# Patient Record
Sex: Male | Born: 1995 | Race: White | Hispanic: No | Marital: Single | Smoking: Former smoker
Health system: Southern US, Community
[De-identification: ages and names within clinical notes are randomized; demographics above are authoritative.]

## PROBLEM LIST (undated history)

## (undated) DIAGNOSIS — D649 Anemia, unspecified: Secondary | ICD-10-CM

## (undated) HISTORY — PX: APPENDECTOMY: SHX54

## (undated) HISTORY — PX: NO PAST SURGERIES: SHX2092

---
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
## 2014-05-27 NOTE — Op Note (Signed)
PATIENT NAME:  Roy BabinskiHUNT, [name] MR#:  [id number] DATE OF BIRTH:  1995-03-15  DATE OF PROCEDURE:  05/18/2011  OPERATION PERFORMED: Laparoscopic appendectomy.   PREOPERATIVE DIAGNOSIS: Acute appendicitis.   POSTOPERATIVE DIAGNOSIS: Acute appendicitis (nonsuppurative).   SURGEON: Claude MangesWilliam F. Myanna Ziesmer, MD  ANESTHESIA: General.   PROCEDURE IN DETAIL: The patient was placed supine on the Operating Room table and prepped and draped in the usual sterile fashion. A 15 mmHg CO2 pneumoperitoneum was created via a Hassan cannula that was introduced in the supraumbilical midline amidst horizontal mattress sutures of 0 Vicryl. Two additional 5 mm trocars were placed under direct visualization. The appendix was seen to be short but certainly edematous (particularly near the end) with a very, very small amount of exudate. There was no pus and the fluid within the pelvis appeared only slightly cloudy but was essentially clear. The mesoappendix was taken down with the Harmonic scalpel and the appendectomy was performed with an Endo GIA stapling device at the base of the appendix where it joined the cecum. It was placed in an Endo Catch bag and [facility] from the abdomen [facility]. The right lower quadrant was irrigated with warm normal saline. This was suctioned clear from the right lower quadrant and the pelvis and the peritoneum was desufflated and decannulated and the linea alba was closed with a running 0 PDS suture and the previously placed Vicryls were tied one to another. All three skin sites were closed with subcuticular 5-0 Monocryl and suture strips. The patient tolerated the procedure well. There were no complications.   ____________________________ Claude MangesWilliam F. Vayda Dungee, MD wfm:cms D: 05/18/2011 05:33:25 ET T: 05/18/2011 08:23:18 ET JOB#: [id number]  cc: Claude MangesWilliam F. Lenzi Marmo, MD, <Dictator> Claude MangesWILLIAM F Cleveland Paiz MD ELECTRONICALLY SIGNED 05/22/2011 9:04

## 2014-05-27 NOTE — H&P (Signed)
    History of Present Illness 15 yowm who had ~ 1 week of central abdominal pain from end of March to early April associated with nausea and vomiting. Pain resolved for ~ 1 week. Pain, nausea, vomiting and anorexia returned 5 days ago. No fever. Last meal 24 hours ago. Pt has lost weight.   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    Place of Living Home   Review of Systems:   Fever/Chills No    Cough No    Sputum No    Abdominal Pain Yes    Diarrhea No    Constipation No    Nausea/Vomiting Yes    SOB/DOE No    Chest Pain No    Dysuria No    Tolerating PT Yes    Tolerating Diet No  Nauseated  Vomiting    Medications/Allergies Reviewed Medications/Allergies reviewed   Physical Exam:   GEN well developed, well nourished, thin, quiet    HEENT pink conjunctivae, PERRL, hearing intact to voice, moist oral mucosa, Oropharynx clear, good dentition    NECK supple    RESP normal resp effort  clear BS  no use of accessory muscles    CARD regular rate  no murmur  no Rub    ABD positive tenderness  normal BS  RLQ > RUQ > R suprapubic >> L side    EXTR negative cyanosis/clubbing, negative edema    SKIN normal to palpation, No rashes, skin turgor good    NEURO cranial nerves intact, motor/sensory function intact    PSYCH alert, A+O to time, place, person, stoic   Routine UA:  14-Apr-13 19:54    Color (UA) Yellow   Clarity (UA) Hazy   Glucose (UA) Negative   Bilirubin (UA) Negative   Ketones (UA) Negative   Specific Gravity (UA) 1.015   Blood (UA) Negative   pH (UA)  7.0   Protein (UA) Negative   Nitrite (UA) Negative   Leukocyte Esterase (UA) Negative   RBC (UA) <1 /HPF   WBC (UA) 2 /HPF   Bacteria (UA) TRACE   Mucous (UA) PRESENT  Routine Hem:  14-Apr-13 19:54    WBC (CBC) 17.0   RBC (CBC) 5.07   Hemoglobin (CBC) 15.6   Hematocrit (CBC) 45.5   Platelet Count (CBC) 227   MCV 90   MCH 30.7   MCHC 34.2   RDW 12.6  Routine Chem:  14-Apr-13 19:54    Lipase 99     Assessment/Admission Diagnosis CT - mildly dilated and thick-walled appendix  Acute (relapsing) Appendicitis    Plan Laparoscopic Appendectomy   Electronic Signatures: Claude MangesMarterre, Jayleon Mcfarlane F (MD)  (Signed 15-Apr-13 03:40)  Authored: CHIEF COMPLAINT and HISTORY, PAST MEDICAL/SURGIAL HISTORY, ALLERGIES, HOME MEDICATIONS, FAMILY AND SOCIAL HISTORY, REVIEW OF SYSTEMS, PHYSICAL EXAM, LABS, ASSESSMENT AND PLAN   Last Updated: 15-Apr-13 03:40 by Claude MangesMarterre, Ileane Sando F (MD)

## 2018-05-20 ENCOUNTER — Emergency Department: Payer: PRIVATE HEALTH INSURANCE

## 2018-05-20 ENCOUNTER — Encounter: Payer: Self-pay | Admitting: Intensive Care

## 2018-05-20 ENCOUNTER — Inpatient Hospital Stay
Admission: EM | Admit: 2018-05-20 | Discharge: 2018-05-23 | DRG: 871 | Disposition: A | Discharge status: Home / Self Care | Payer: PRIVATE HEALTH INSURANCE | Attending: Internal Medicine | Admitting: Internal Medicine

## 2018-05-20 ENCOUNTER — Other Ambulatory Visit: Payer: Self-pay

## 2018-05-20 DIAGNOSIS — J189 Pneumonia, unspecified organism: Secondary | ICD-10-CM | POA: Diagnosis not present

## 2018-05-20 DIAGNOSIS — R55 Syncope and collapse: Secondary | ICD-10-CM

## 2018-05-20 DIAGNOSIS — E876 Hypokalemia: Secondary | ICD-10-CM | POA: Diagnosis not present

## 2018-05-20 DIAGNOSIS — Z20828 Contact with and (suspected) exposure to other viral communicable diseases: Secondary | ICD-10-CM | POA: Diagnosis present

## 2018-05-20 DIAGNOSIS — A419 Sepsis, unspecified organism: Principal | ICD-10-CM | POA: Diagnosis present

## 2018-05-20 DIAGNOSIS — D649 Anemia, unspecified: Secondary | ICD-10-CM | POA: Diagnosis not present

## 2018-05-20 HISTORY — DX: Anemia, unspecified: D64.9

## 2018-05-20 HISTORY — DX: Sepsis, unspecified organism: A41.9

## 2018-05-20 HISTORY — DX: Sepsis, unspecified organism: J18.9

## 2018-05-20 LAB — INFLUENZA PANEL BY PCR (TYPE A & B)
Influenza A By PCR: NEGATIVE
Influenza B By PCR: NEGATIVE

## 2018-05-20 LAB — COMPREHENSIVE METABOLIC PANEL
ALT: 40 U/L (ref 0–44)
AST: 52 U/L — ABNORMAL HIGH (ref 15–41)
Albumin: 3.4 g/dL — ABNORMAL LOW (ref 3.5–5.0)
Alkaline Phosphatase: 73 U/L (ref 38–126)
Anion gap: 15 (ref 5–15)
BUN: 10 mg/dL (ref 6–20)
CO2: 26 mmol/L (ref 22–32)
Calcium: 8.6 mg/dL — ABNORMAL LOW (ref 8.9–10.3)
Chloride: 92 mmol/L — ABNORMAL LOW (ref 98–111)
Creatinine, Ser: 0.71 mg/dL (ref 0.61–1.24)
GFR calc Af Amer: >60 mL/min (ref >60)
GFR calc non Af Amer: >60 mL/min (ref >60)
Glucose, Bld: 112 mg/dL — ABNORMAL HIGH (ref 70–99)
Potassium: 3.4 mmol/L — ABNORMAL LOW (ref 3.5–5.1)
Sodium: 133 mmol/L — ABNORMAL LOW (ref 135–145)
Total Bilirubin: 1.5 mg/dL — ABNORMAL HIGH (ref 0.3–1.2)
Total Protein: 8.4 g/dL — ABNORMAL HIGH (ref 6.5–8.1)

## 2018-05-20 LAB — URINALYSIS, COMPLETE (UACMP) WITH MICROSCOPIC
Bacteria, UA: NONE SEEN
Bilirubin Urine: NEGATIVE
Glucose, UA: NEGATIVE mg/dL
Hgb urine dipstick: NEGATIVE
Ketones, ur: 20 mg/dL — AB
Leukocytes,Ua: NEGATIVE
Nitrite: NEGATIVE
Protein, ur: NEGATIVE mg/dL
Specific Gravity, Urine: 1.008 (ref 1.005–1.030)
pH: 7 (ref 5.0–8.0)

## 2018-05-20 LAB — CBC WITH DIFFERENTIAL/PLATELET
Abs Immature Granulocytes: 0.13 10*3/uL — ABNORMAL HIGH (ref 0.00–0.07)
Basophils Absolute: 0 10*3/uL (ref 0.0–0.1)
Basophils Relative: 0 %
Eosinophils Absolute: 0 10*3/uL (ref 0.0–0.5)
Eosinophils Relative: 0 %
HCT: 35.4 % — ABNORMAL LOW (ref 39.0–52.0)
Hemoglobin: 12 g/dL — ABNORMAL LOW (ref 13.0–17.0)
Immature Granulocytes: 1 %
Lymphocytes Relative: 6 %
Lymphs Abs: 1 10*3/uL (ref 0.7–4.0)
MCH: 30.2 pg (ref 26.0–34.0)
MCHC: 33.9 g/dL (ref 30.0–36.0)
MCV: 89.2 fL (ref 80.0–100.0)
Monocytes Absolute: 0.6 10*3/uL (ref 0.1–1.0)
Monocytes Relative: 3 %
Neutro Abs: 16.4 10*3/uL — ABNORMAL HIGH (ref 1.7–7.7)
Neutrophils Relative %: 90 %
Platelets: 622 10*3/uL — ABNORMAL HIGH (ref 150–400)
RBC: 3.97 MIL/uL — ABNORMAL LOW (ref 4.22–5.81)
RDW: 11.9 % (ref 11.5–15.5)
WBC: 18.2 10*3/uL — ABNORMAL HIGH (ref 4.0–10.5)
nRBC: 0 % (ref 0.0–0.2)

## 2018-05-20 LAB — PROCALCITONIN: Procalcitonin: <0.1 ng/mL

## 2018-05-20 LAB — LACTIC ACID, PLASMA: Lactic Acid, Venous: 1.5 mmol/L (ref 0.5–1.9)

## 2018-05-20 LAB — SARS CORONAVIRUS 2 BY RT PCR (HOSPITAL ORDER, PERFORMED IN ~~LOC~~ HOSPITAL LAB): SARS Coronavirus 2: NEGATIVE

## 2018-05-20 MED ORDER — SODIUM CHLORIDE 0.9 % IV BOLUS
1000.0000 mL | Freq: Once | INTRAVENOUS | Status: AC
  Administered 2018-05-20: 1000 mL via INTRAVENOUS

## 2018-05-20 MED ORDER — VANCOMYCIN HCL 10 G IV SOLR
1500.0000 mg | Freq: Once | INTRAVENOUS | Status: AC
  Administered 2018-05-21: 01:00:00 1500 mg via INTRAVENOUS
  Filled 2018-05-20: qty 1500

## 2018-05-20 MED ORDER — GUAIFENESIN-DM 100-10 MG/5ML PO SYRP: 10.0000 mL | ORAL_SOLUTION | ORAL | Status: DC | PRN

## 2018-05-20 MED ORDER — ACETAMINOPHEN 325 MG PO TABS
650.0000 mg | ORAL_TABLET | Freq: Four times a day (QID) | ORAL | Status: DC | PRN
  Administered 2018-05-21 – 2018-05-23 (×3): 650 mg via ORAL
  Filled 2018-05-20 (×4): qty 2

## 2018-05-20 MED ORDER — ONDANSETRON HCL 4 MG/2ML IJ SOLN
4.0000 mg | Freq: Once | INTRAMUSCULAR | Status: AC
  Administered 2018-05-20: 17:00:00 4 mg via INTRAVENOUS
  Filled 2018-05-20: qty 2

## 2018-05-20 MED ORDER — ENOXAPARIN SODIUM 40 MG/0.4ML ~~LOC~~ SOLN
40.0000 mg | SUBCUTANEOUS | Status: DC
  Administered 2018-05-21 – 2018-05-22 (×3): 40 mg via SUBCUTANEOUS
  Filled 2018-05-20 (×3): qty 0.4

## 2018-05-20 MED ORDER — ONDANSETRON HCL 4 MG PO TABS: 4.0000 mg | ORAL_TABLET | Freq: Four times a day (QID) | ORAL | Status: DC | PRN

## 2018-05-20 MED ORDER — ACETAMINOPHEN 325 MG PO TABS
650.0000 mg | ORAL_TABLET | Freq: Once | ORAL | Status: AC
  Administered 2018-05-20: 17:00:00 650 mg via ORAL
  Filled 2018-05-20: qty 2

## 2018-05-20 MED ORDER — ONDANSETRON HCL 4 MG/2ML IJ SOLN: 4.0000 mg | Freq: Four times a day (QID) | INTRAMUSCULAR | Status: DC | PRN

## 2018-05-20 MED ORDER — BENZONATATE 100 MG PO CAPS: 200.0000 mg | ORAL_CAPSULE | Freq: Three times a day (TID) | ORAL | Status: DC | PRN

## 2018-05-20 MED ORDER — SODIUM CHLORIDE 0.9 % IV SOLN
1.0000 g | Freq: Once | INTRAVENOUS | Status: AC
  Administered 2018-05-20: 1 g via INTRAVENOUS
  Filled 2018-05-20: qty 10

## 2018-05-20 MED ORDER — SODIUM CHLORIDE 0.9 % IV SOLN
2.0000 g | Freq: Three times a day (TID) | INTRAVENOUS | Status: DC
  Administered 2018-05-20 – 2018-05-21 (×3): 2 g via INTRAVENOUS
  Filled 2018-05-20 (×4): qty 2

## 2018-05-20 MED ORDER — SODIUM CHLORIDE 0.9 % IV SOLN
500.0000 mg | Freq: Once | INTRAVENOUS | Status: AC
  Administered 2018-05-20: 21:00:00 500 mg via INTRAVENOUS
  Filled 2018-05-20: qty 500

## 2018-05-20 NOTE — ED Notes (Signed)
Informed by 2A bed assignment may change; will give report once RN available.

## 2018-05-20 NOTE — Consult Note (Signed)
Pharmacy Antibiotic Note  Roy Torres is a 23 y.o. male admitted on 05/20/2018 with sepsis.  Pharmacy has been consulted for Cefepime and Vancomycin dosing.  Plan: Vancomycin 1500mg  IV loading dose x 1. Followed by Vancomycin 1250 mg IV Q 12 hrs. Goal AUC 400-550. Expected AUC: 461.3 SCr used: 0.8 Css Min: 10.6  Start Cefepime 2g IV every 8 hours    Height: 5\' 11"  (180.3 cm) Weight: 145 lb (65.8 kg) IBW/kg (Calculated) : 75.3  Temp (24hrs), Avg:100.4 F (38 C), Min:100.4 F (38 C), Max:100.4 F (38 C)  Recent Labs  Lab 05/20/18 1558 05/20/18 1605  WBC  --  18.2*  CREATININE  --  0.71  LATICACIDVEN 1.5  --     Estimated Creatinine Clearance: 132.5 mL/min (by C-G formula based on SCr of 0.71 mg/dL).    No Known Allergies  Antimicrobials this admission: 4/17 Ceftriaxone/Azithro >> x 1   4/17 Cefepime >>  4/17 Vancomycin>>  Microbiology results: 4/17 BCx: pending 4/17 UCx: pending 4/17 Resp PCR: pending  4/17 SARS Coronavirus 2: negative   Thank you for allowing pharmacy to be a part of this patient's care.  Gardner Candle, PharmD, BCPS Clinical Pharmacist 05/20/2018 10:00 PM

## 2018-05-20 NOTE — ED Notes (Signed)
Pt leaving for imaging.

## 2018-05-20 NOTE — ED Notes (Signed)
Precautions initiated/continued. Visible/hung on door.

## 2018-05-20 NOTE — ED Notes (Addendum)
Pt denies hitting head. Syncopal episode witnessed. States sat on buttocks when fell. Denies complete LOC; states couldn't see but could still hear. Pt denies any needs. Offered blanket; denied. States has not taken any tylenol or ibuprofen today.

## 2018-05-20 NOTE — ED Notes (Signed)
Only 50mL + able to fill on 2nd blue top of cultures. Unable to pull red with butterfly. Sent to lab.

## 2018-05-20 NOTE — ED Notes (Signed)
ED TO INPATIENT HANDOFF REPORT  ED Nurse Name and Phone #: Greta Doom [phone]  S Name/Age/Gender Roy Torres 23 y.o. male Room/Bed: ED25A/ED25A  Code Status   Code Status: Not on file  Home/SNF/Other Home Patient oriented to: self, place, time and situation Is this baseline? Yes   Triage Complete: Triage complete  Chief Complaint Passed out  Triage Note Pt presents after syncopal episode x 1 hr ago. Pt states he was called by his PCP regarding anemia. Pt states while he was getting ready to go get rx, he passed out. Pt denies injury.   Allergies No Known Allergies  Level of Care/Admitting Diagnosis ED Disposition    ED Disposition Condition Comment   Admit  Hospital Area: [facility] [[id number]]  Level of Care: Med-Surg [16]  Covid Evaluation: Person Under Investigation (PUI)  Isolation Risk Level: Low Risk  (Less than 4L [place] supplementation)  Diagnosis: Sepsis [facility]) [[id number]]  Admitting Physician: Oralia Manis [[id number]]  Attending Physician: Oralia Manis [phone]  Estimated length of stay: past midnight tomorrow  Certification:: I certify this patient will need inpatient services for at least 2 midnights  PT Class (Do Not Modify): Inpatient [101]  PT Acc Code (Do Not Modify): Private [1]       B Medical/Surgery History Past Medical History:  Diagnosis Date  . Anemia    Past Surgical History:  Procedure Laterality Date  . NO PAST SURGERIES       A IV Location/Drains/Wounds Patient Lines/Drains/Airways Status   Active Line/Drains/Airways    Name:   Placement date:   Placement time:   Site:   Days:   Peripheral IV 05/20/18 Left Arm   05/20/18    1603    Arm   less than 1   Peripheral IV 05/20/18 Left Forearm   05/20/18    1759    Forearm   less than 1          Intake/Output Last 24 hours No intake or output data in the 24 hours ending 05/20/18 2122  Labs/Imaging Results for orders placed or performed during the hospital  encounter of 05/20/18 (from the past 48 hour(s))  Lactic acid, plasma     Status: None   Collection Time: 05/20/18  3:58 PM  Result Value Ref Range   Lactic Acid, Venous 1.5 0.5 - 1.9 mmol/L    Comment: Performed at [facility], [address] [place], [place] [address]  Procalcitonin     Status: None   Collection Time: 05/20/18  4:05 PM  Result Value Ref Range   Procalcitonin <0.10 ng/mL    Comment:        Interpretation: PCT (Procalcitonin) <= 0.5 ng/mL: Systemic infection (sepsis) is not likely. Local bacterial infection is possible. (NOTE)       Sepsis PCT Algorithm           Lower Respiratory Tract                                      Infection PCT Algorithm    ----------------------------     ----------------------------         PCT < 0.25 ng/mL                PCT < 0.10 ng/mL         Strongly encourage             Strongly  discourage   discontinuation of antibiotics    initiation of antibiotics    ----------------------------     -----------------------------       PCT 0.25 - 0.50 ng/mL            PCT 0.10 - 0.25 ng/mL               OR       >80% decrease in PCT            Discourage initiation of                                            antibiotics      Encourage discontinuation           of antibiotics    ----------------------------     -----------------------------         PCT >= 0.50 ng/mL              PCT 0.26 - 0.50 ng/mL               AND        <80% decrease in PCT             Encourage initiation of                                             antibiotics       Encourage continuation           of antibiotics    ----------------------------     -----------------------------        PCT >= 0.50 ng/mL                  PCT > 0.50 ng/mL               AND         increase in PCT                  Strongly encourage                                      initiation of antibiotics    Strongly encourage escalation           of antibiotics                                      -----------------------------                                           PCT <= 0.25 ng/mL                                                 OR                                        >  80% decrease in PCT                                     Discontinue / Do not initiate                                             antibiotics Performed at [facility], [address] [place], [place] [address]   CBC WITH DIFFERENTIAL     Status: Abnormal   Collection Time: 05/20/18  4:05 PM  Result Value Ref Range   WBC 18.2 (H) 4.0 - 10.5 K/uL   RBC 3.97 (L) 4.22 - 5.81 MIL/uL   Hemoglobin 12.0 (L) 13.0 - 17.0 g/dL   HCT 60.4 (L) 54.0 - 98.1 %   MCV 89.2 80.0 - 100.0 fL   MCH 30.2 26.0 - 34.0 pg   MCHC 33.9 30.0 - 36.0 g/dL   RDW 19.1 47.8 - 29.5 %   Platelets 622 (H) 150 - 400 K/uL   nRBC 0.0 0.0 - 0.2 %   Neutrophils Relative % 90 %   Neutro Abs 16.4 (H) 1.7 - 7.7 K/uL   Lymphocytes Relative 6 %   Lymphs Abs 1.0 0.7 - 4.0 K/uL   Monocytes Relative 3 %   Monocytes Absolute 0.6 0.1 - 1.0 K/uL   Eosinophils Relative 0 %   Eosinophils Absolute 0.0 0.0 - 0.5 K/uL   Basophils Relative 0 %   Basophils Absolute 0.0 0.0 - 0.1 K/uL   Immature Granulocytes 1 %   Abs Immature Granulocytes 0.13 (H) 0.00 - 0.07 K/uL    Comment: Performed at [facility], [address] [place], [place] [address]  Comprehensive metabolic panel     Status: Abnormal   Collection Time: 05/20/18  4:05 PM  Result Value Ref Range   Sodium 133 (L) 135 - 145 mmol/L   Potassium 3.4 (L) 3.5 - 5.1 mmol/L   Chloride 92 (L) 98 - 111 mmol/L   CO2 26 22 - 32 mmol/L   Glucose, Bld 112 (H) 70 - 99 mg/dL   BUN 10 6 - 20 mg/dL   Creatinine, Ser 8.65 0.61 - 1.24 mg/dL   Calcium 8.6 (L) 8.9 - 10.3 mg/dL   Total Protein 8.4 (H) 6.5 - 8.1 g/dL   Albumin 3.4 (L) 3.5 - 5.0 g/dL   AST 52 (H) 15 - 41 U/L   ALT 40 0 - 44 U/L   Alkaline Phosphatase 73 38 - 126 U/L   Total Bilirubin 1.5 (H) 0.3 - 1.2  mg/dL   GFR calc non Af Amer >60 >60 mL/min   GFR calc Af Amer >60 >60 mL/min   Anion gap 15 5 - 15    Comment: Performed at [facility], [address] [place], [place] [address]  Urinalysis, Complete w Microscopic     Status: Abnormal   Collection Time: 05/20/18  4:05 PM  Result Value Ref Range   Color, Urine YELLOW (A) YELLOW   APPearance CLEAR (A) CLEAR   Specific Gravity, Urine 1.008 1.005 - 1.030   pH 7.0 5.0 - 8.0   Glucose, UA NEGATIVE NEGATIVE mg/dL   Hgb urine dipstick NEGATIVE NEGATIVE   Bilirubin Urine NEGATIVE NEGATIVE   Ketones, ur 20 (A) NEGATIVE mg/dL   Protein, ur NEGATIVE  NEGATIVE mg/dL   Nitrite NEGATIVE NEGATIVE   Leukocytes,Ua NEGATIVE NEGATIVE   RBC / HPF 0-5 0 - 5 RBC/hpf   WBC, UA 0-5 0 - 5 WBC/hpf   Bacteria, UA NONE SEEN NONE SEEN   Squamous Epithelial / LPF 0-5 0 - 5    Comment: Performed at [facility], [address], [place], [place] [address]  SARS Coronavirus 2 [facility] order, Performed in [facility] hospital lab)     Status: None   Collection Time: 05/20/18  6:09 PM  Result Value Ref Range   SARS Coronavirus 2 NEGATIVE NEGATIVE    Comment: (NOTE) If result is NEGATIVE SARS-CoV-2 target nucleic acids are NOT DETECTED. The SARS-CoV-2 RNA is generally detectable in upper and lower  respiratory specimens during the acute phase of infection. The lowest  concentration of SARS-CoV-2 viral copies this assay can detect is 250  copies / mL. A negative result does not preclude SARS-CoV-2 infection  and should not be used as the sole basis for treatment or other  patient management decisions.  A negative result may occur with  improper specimen collection / handling, submission of specimen other  than nasopharyngeal swab, presence of viral mutation(s) within the  areas targeted by this assay, and inadequate number of viral copies  (<250 copies / mL). A negative result must be combined with clinical  observations, patient history,  and epidemiological information. If result is POSITIVE SARS-CoV-2 target nucleic acids are DETECTED. The SARS-CoV-2 RNA is generally detectable in upper and lower  respiratory specimens dur ing the acute phase of infection.  Positive  results are indicative of active infection with SARS-CoV-2.  Clinical  correlation with patient history and other diagnostic information is  necessary to determine patient infection status.  Positive results do  not rule out bacterial infection or co-infection with other viruses. If result is PRESUMPTIVE POSTIVE SARS-CoV-2 nucleic acids MAY BE PRESENT.   A presumptive positive result was obtained on the submitted specimen  and confirmed on repeat testing.  While 2019 novel coronavirus  (SARS-CoV-2) nucleic acids may be present in the submitted sample  additional confirmatory testing may be necessary for epidemiological  and / or clinical management purposes  to differentiate between  SARS-CoV-2 and other Sarbecovirus currently known to infect humans.  If clinically indicated additional testing with an alternate test  methodology [phone]) is advised. The SARS-CoV-2 RNA is generally  detectable in upper and lower respiratory sp ecimens during the acute  phase of infection. The expected result is Negative. Fact Sheet for Patients:  BoilerBrush.com.cy Fact Sheet for Healthcare Providers: https://pope.com/ This test is not yet approved or cleared by the [place] FDA and has been authorized for detection and/or diagnosis of SARS-CoV-2 by FDA under an Emergency Use Authorization (EUA).  This EUA will remain in effect (meaning this test can be used) for the duration of the COVID-19 declaration under Section 564(b)(1) of the Act, 21 U.S.C. section 360bbb-3(b)(1), unless the authorization is terminated or revoked sooner. Performed at [facility], [address], [place], [place] [address]    Dg  Chest Port 1 View  Result Date: 05/20/2018 CLINICAL DATA:  Syncope EXAM: PORTABLE CHEST 1 VIEW COMPARISON:  None. FINDINGS: There is diffuse reticulonodular interstitial disease throughout the lungs bilaterally. There is no appreciable frank airspace consolidation. Heart size and pulmonary vascularity are normal. No adenopathy. No bone lesions. 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 Question atypical infection as a potential etiology for this widespread finding.  There is no airspace consolidation. Heart size and pulmonary vascularity are normal. No adenopathy. Electronically Signed   By: Bretta BangWilliam  Woodruff III M.D.   On: 05/20/2018 16:34   Ct Renal Stone Study  Result Date: 05/20/2018 CLINICAL DATA:  Bilateral flank pain and low-grade fever. EXAM: CT ABDOMEN AND PELVIS WITHOUT CONTRAST TECHNIQUE: Multidetector CT imaging of the abdomen and pelvis was performed following the standard protocol without IV contrast. COMPARISON:  CT 06/03/2011 FINDINGS: Lower chest: Widespread pulmonary density which is interstitial and miliary consistent with acute widespread pneumonia. This could be viral or due to other atypical organisms. No pleural effusion. Hepatobiliary: Normal Pancreas: Normal Spleen: Normal Adrenals/Urinary Tract: Adrenal glands are normal. Kidneys are normal. No cyst, mass, stone or hydronephrosis. Bladder is normal. Stomach/Bowel: No abnormal bowel finding.  Previous appendectomy. Vascular/Lymphatic: Normal Reproductive: Normal Other: No free fluid or air. Musculoskeletal: Normal IMPRESSION: Widespread pulmonary abnormality with interstitial prominence and miliary type airspace filling. This pattern is consistent with acute widespread pneumonia and could be due to atypical organisms such as viral pneumonia or tuberculosis. No pleural effusions. No evidence of urinary tract stone disease. No abdominal or pelvic pathology visible by CT. Previous appendectomy.  Electronically Signed   By: Paulina FusiMark  Shogry M.D.   On: 05/20/2018 16:37    Pending Labs Unresulted Labs (From admission, onward)    Start     Ordered   05/20/18 2053  Respiratory Panel by PCR  Add-on,   AD    Question:  Patient immune status  Answer:  Normal   05/20/18 2052   05/20/18 2053  Influenza panel by PCR (type A & B)  Add-on,   AD     05/20/18 2052   05/20/18 1649  Culture, blood (routine x 2)  BLOOD CULTURE X 2,   STAT     05/20/18 1648   05/20/18 1647  HIV antibody (Routine Testing)  Once,   STAT     05/20/18 1647   05/20/18 1605  Urine culture  ONCE - STAT,   STAT     05/20/18 1605   Signed and Held  SARS Coronavirus 2 [facility](Hospital order, Performed [facility])  (Novel Coronavirus, [facility](Hospital Order))  Tomorrow morning,   R    Question:  Patient immune status  Answer:  Normal   Signed and Held   Signed and Held  CBC  (enoxaparin (LOVENOX)    CrCl >/= 30 ml/min)  Once,   R    Comments:  Baseline for enoxaparin therapy IF NOT ALREADY DRAWN.  Notify MD if PLT < 100 K.    Signed and Held   Signed and Held  Creatinine, serum  (enoxaparin (LOVENOX)    CrCl >/= 30 ml/min)  Once,   R    Comments:  Baseline for enoxaparin therapy IF NOT ALREADY DRAWN.    Signed and Held   Signed and Held  Creatinine, serum  (enoxaparin (LOVENOX)    CrCl >/= 30 ml/min)  Weekly,   R    Comments:  while on enoxaparin therapy    Signed and Held   Signed and Held  ABO/Rh  Once,   R     Signed and Held   Signed and Held  CBC with Differential/Platelet  Daily,   R     Signed and Held   Signed and Held  Comprehensive metabolic panel  Daily,   R     Signed and Held   Signed and Held  MRSA PCR Screening  Once,   R    Question:  Patient immune status  Answer:  Normal   Signed and Held          Vitals/Pain Today's Vitals   05/20/18 1901 05/20/18 1902 05/20/18 1953 05/20/18 2115  BP:  (!) 132/92    Pulse: 88 85 84 87  Resp: 19 20 13  (!) 23  Temp:      TempSrc:      SpO2: 98%  97% 100% 99%  Weight:      Height:      PainSc:        Isolation Precautions Airborne and Contact precautions  Medications Medications  azithromycin (ZITHROMAX) 500 mg in sodium chloride 0.9 % 250 mL IVPB (500 mg Intravenous Transfusing/Transfer 05/20/18 2118)  sodium chloride 0.9 % bolus 1,000 mL (1,000 mLs Intravenous New Bag/Given 05/20/18 1631)  acetaminophen (TYLENOL) tablet 650 mg (650 mg Oral Given 05/20/18 1723)  ondansetron (ZOFRAN) injection 4 mg (4 mg Intravenous Given 05/20/18 1720)  cefTRIAXone (ROCEPHIN) 1 g in sodium chloride 0.9 % 100 mL IVPB (0 g Intravenous Stopped 05/20/18 2114)    Mobility walks Low fall risk   Focused Assessments N/V; controlled with zofran; Cardiac/Resp WDL   R Recommendations: See Admitting Provider Note  Report given to:   Additional Notes:  Septic workup completed; Covid-19 neg; NS bolus/rocephin/azithromycin given; L arm/L fa 20g IV

## 2018-05-20 NOTE — ED Triage Notes (Signed)
Pt presents after syncopal episode x 1 hr ago. Pt states he was called by his PCP regarding anemia. Pt states while he was getting ready to go get rx, he passed out. Pt denies injury.

## 2018-05-20 NOTE — ED Notes (Signed)
2A RN remains unavailable. Will give report once available. Will continue to monitor pt.

## 2018-05-20 NOTE — H&P (Addendum)
[facility] - [place] at [facility]   PATIENT NAME: Roy Torres    MR#:  [id number]  DATE OF BIRTH:  [birth year]  DATE OF ADMISSION:  05/20/2018  PRIMARY CARE PHYSICIAN: Patient, No Pcp Per   REQUESTING/REFERRING PHYSICIAN: Alphonzo Lemmings, MD  CHIEF COMPLAINT:   Chief Complaint  Patient presents with  . Loss of Consciousness    HISTORY OF PRESENT ILLNESS:  Roy Torres  is a 23 y.o. male who presents with chief complaint as above.  Patient presents to the ED with 1 week of intermittent fever, and a syncopal episode today.  Here in the ED he was found to meet sepsis criteria with tachycardia, fever, leukocytosis.  His chest x-ray shows bilateral reticulonodular infiltrate, consistent with atypical infection.  On his differential he is lymphopenic, and his procalcitonin was less than 0.1.  All this raises strong suspicion for possible novel coronavirus infection.  Swab was sent tonight and resulted initially negative.  Hospitalist were called for admission.  PAST MEDICAL HISTORY:   Past Medical History:  Diagnosis Date  . Anemia      PAST SURGICAL HISTORY:   Past Surgical History:  Procedure Laterality Date  . NO PAST SURGERIES       SOCIAL HISTORY:   Social History   Tobacco Use  . Smoking status: Never Smoker  . Smokeless tobacco: Never Used  Substance Use Topics  . Alcohol use: Never    Frequency: Never     FAMILY HISTORY:    Family history reviewed and is non-contributory DRUG ALLERGIES:  No Known Allergies  MEDICATIONS AT HOME:   Prior to Admission medications   Not on File    REVIEW OF SYSTEMS:  Review of Systems  Constitutional: Positive for fever. Negative for chills, malaise/fatigue and weight loss.  HENT: Negative for ear pain, hearing loss and tinnitus.   Eyes: Negative for blurred vision, double vision, pain and redness.  Respiratory: Negative for cough, hemoptysis and shortness of breath.   Cardiovascular: Negative for  chest pain, palpitations, orthopnea and leg swelling.  Gastrointestinal: Positive for nausea. Negative for abdominal pain, constipation, diarrhea and vomiting.  Genitourinary: Negative for dysuria, frequency and hematuria.  Musculoskeletal: Negative for back pain, joint pain and neck pain.  Skin:       No acne, rash, or lesions  Neurological: Positive for loss of consciousness. Negative for dizziness, tremors, focal weakness and weakness.  Endo/Heme/Allergies: Negative for polydipsia. Does not bruise/bleed easily.  Psychiatric/Behavioral: Negative for depression. The patient is not nervous/anxious and does not have insomnia.      VITAL SIGNS:   Vitals:   05/20/18 1800 05/20/18 1823 05/20/18 1901 05/20/18 1902  BP: 124/72   (!) 132/92  Pulse: 97 91 88 85  Resp: Temp:      TempSrc:      SpO2: 98% 97% 98% 97%  Weight:      Height:       Wt Readings from Last 3 Encounters:  05/20/18 65.8 kg    PHYSICAL EXAMINATION:  Physical Exam  Vitals reviewed. Constitutional: He is oriented to person, place, and time. He appears well-developed and well-nourished. No distress.  HENT:  Head: Normocephalic and atraumatic.  Dry mucous membranes  Eyes: Pupils are equal, round, and reactive to light. Conjunctivae and EOM are normal. No scleral icterus.  Neck: Normal range of motion. Neck supple. No JVD present. No thyromegaly present.  Cardiovascular: Regular rhythm and intact distal pulses. Exam reveals no gallop  and no friction rub.  No murmur heard. Tachycardic  Respiratory: Effort normal. No respiratory distress. He has no wheezes. He has no rales.  Fine rhonchi bilaterally  GI: Soft. Bowel sounds are normal. He exhibits no distension. There is no abdominal tenderness.  Musculoskeletal: Normal range of motion.        General: No edema.     Comments: No arthritis, no gout  Lymphadenopathy:    He has no cervical adenopathy.  Neurological: He is alert and oriented to person,  place, and time. No cranial nerve deficit.  No dysarthria, no aphasia  Skin: Skin is warm and dry. No rash noted. No erythema.  Psychiatric: He has a normal mood and affect. His behavior is normal. Judgment and thought content normal.    LABORATORY PANEL:   CBC Recent Labs  Lab 05/20/18 1605  WBC 18.2*  HGB 12.0*  HCT 35.4*  PLT 622*   ------------------------------------------------------------------------------------------------------------------  Chemistries  Recent Labs  Lab 05/20/18 1605  NA 133*  K 3.4*  CL 92*  CO2 26  GLUCOSE 112*  BUN 10  CREATININE 0.71  CALCIUM 8.6*  AST 52*  ALT 40  ALKPHOS 73  BILITOT 1.5*   ------------------------------------------------------------------------------------------------------------------  Cardiac Enzymes No results for input(s): TROPONINI in the last 168 hours. ------------------------------------------------------------------------------------------------------------------  RADIOLOGY:  Dg Chest Port 1 View  Result Date: 05/20/2018 CLINICAL DATA:  Syncope EXAM: PORTABLE CHEST 1 VIEW COMPARISON:  None. FINDINGS: There is diffuse reticulonodular interstitial disease throughout the lungs bilaterally. There is no appreciable frank airspace consolidation. Heart size and pulmonary vascularity are normal. No adenopathy. No bone lesions. 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 Question atypical infection as a potential etiology for this widespread finding. There is no airspace consolidation. Heart size and pulmonary vascularity are normal. No adenopathy. Electronically Signed   By: Bretta Bang III M.D.   On: 05/20/2018 16:34   Ct Renal Stone Study  Result Date: 05/20/2018 CLINICAL DATA:  Bilateral flank pain and low-grade fever. EXAM: CT ABDOMEN AND PELVIS WITHOUT CONTRAST TECHNIQUE: Multidetector CT imaging of the abdomen and pelvis was performed following the standard protocol  without IV contrast. COMPARISON:  CT 06/03/2011 FINDINGS: Lower chest: Widespread pulmonary density which is interstitial and miliary consistent with acute widespread pneumonia. This could be viral or due to other atypical organisms. No pleural effusion. Hepatobiliary: Normal Pancreas: Normal Spleen: Normal Adrenals/Urinary Tract: Adrenal glands are normal. Kidneys are normal. No cyst, mass, stone or hydronephrosis. Bladder is normal. Stomach/Bowel: No abnormal bowel finding.  Previous appendectomy. Vascular/Lymphatic: Normal Reproductive: Normal Other: No free fluid or air. Musculoskeletal: Normal IMPRESSION: Widespread pulmonary abnormality with interstitial prominence and miliary type airspace filling. This pattern is consistent with acute widespread pneumonia and could be due to atypical organisms such as viral pneumonia or tuberculosis. No pleural effusions. No evidence of urinary tract stone disease. No abdominal or pelvic pathology visible by CT. Previous appendectomy. Electronically Signed   By: Paulina Fusi M.D.   On: 05/20/2018 16:37    EKG:   Orders placed or performed during the hospital encounter of 05/20/18  . EKG 12-Lead  . EKG 12-Lead    IMPRESSION AND PLAN:  Principal Problem:   Sepsis (HCC) -IV antibiotics initiated, lactic acid within normal limits.  Blood pressure stable.  Sepsis is due to pneumonia as below.  Cultures sent from the ED. Active Problems:   Atypical pneumonia -patient's clinical and laboratory picture very consistent with normal coronavirus infection.  He was coronavirus PCR negative tonight,  however I am unsure of the sensitivity data on this test.  His clinical picture and laboratory picture is very consistent with coronavirus and lymphopenia, pro calcitonin less than 0.1, and bilateral atypical appearing opacities on imaging.  We have covered him with IV antibiotics for presumed pneumonia at this time, however I will repeat his novel coronavirus test in the morning  just for confirmation.   Suspected COVID-19 infection -given suspicions as above, will retest for novel coronavirus in the morning  Chart review performed and case discussed with ED provider. Labs, imaging and/or ECG reviewed by provider and discussed with patient/family. Management plans discussed with the patient and/or family.  DVT PROPHYLAXIS: SubQ lovenox   GI PROPHYLAXIS:  None  ADMISSION STATUS: Inpatient     CODE STATUS: Full  TOTAL TIME TAKING CARE OF THIS PATIENT: 45 minutes.   Barney DrainDavid F Sou Nohr 05/20/2018, 8:53 PM  [facility] [place] [facility]  Office  [phone]  CC: Primary care physician; Patient, No Pcp Per  Note:  This document was prepared using Dragon voice recognition software and may include unintentional dictation errors.

## 2018-05-20 NOTE — ED Notes (Addendum)
This RN, previously placed on standby secondary to census, now on the floor and looking into patient's ED chart.

## 2018-05-20 NOTE — ED Notes (Signed)
EKG completed for syncopal episode/weakness.

## 2018-05-20 NOTE — ED Notes (Signed)
To consolidate patient care and staff resources, it has been decided that the patient will go into 233 at this time.

## 2018-05-20 NOTE — ED Provider Notes (Addendum)
[facility] Emergency Department Provider Note  ____________________________________________   I have reviewed the triage vital signs and the nursing notes. Where available I have reviewed prior notes and, if possible and indicated, outside hospital notes.    HISTORY  Chief Complaint Loss of Consciousness    HPI Roy Torres is a 23 y.o. 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  Not specifically in any other way unwell.  He states that he has had no dysuria but he has low back pain and he was worried that he might have a urinary tract infection.  He went to his physician and they checked a urine and blood and they called him on the phone today and told him that his blood work showed that he was anemic.  He then stood up rapidly from bed to come in and he felt very lightheaded, he did not completely pass out he did not hit his head but he did fall to the ground.  He did not suffer any injury he did not have a seizure he did not completely lose consciousness he did not bite his tongue he did not lose bowel or bladder he had no focal numbness or weakness.  He states his pain is mostly in the left flank but also some degree in the right flank and has been there for a week.  He has had fevers at home as well as here.  He denies any cough shortness of breath headache stiff neck or abdominal pain.  He has had no melena no bright red blood per rectum no hematemesis.  His fevers at home were low-grade when he checks them.  He has not had any sick contacts that he knows of.  No recent travel.  His only real complaint is that he feels decreased energy and has a low back pain.  Not have a history of IV drug abuse.  The pain is not midline.  It is mostly in the flanks.  He does not have a history of kidney stones although there is a kidney stone history in his family.  He is  not having any chest pain pleuritic or otherwise.  He does not have any cough.  He has not had any stiff neck or headache of any significance.  He denies any rash.  Denies IV drug abuse.     Past Medical History:  Diagnosis Date  . Anemia     There are no active problems to display for this patient.   History reviewed. No pertinent surgical history.  Prior to Admission medications   Not on File    Allergies Patient has no known allergies.  History reviewed. No pertinent family history.  Social History Social History   Tobacco Use  . Smoking status: Never Smoker  . Smokeless tobacco: Never Used  Substance Use Topics  . Alcohol use: Never    Frequency: Never  . Drug use: Never    Review of Systems Constitutional: The HPI Eyes: No visual changes. ENT: No sore throat. No stiff neck no neck pain Cardiovascular: Denies chest pain. Respiratory: Denies shortness of breath. Gastrointestinal:   no vomiting.  No diarrhea.  No constipation. Genitourinary: Negative for dysuria. Musculoskeletal: Negative lower extremity swelling Skin: Negative for rash. Neurological: Negative for severe headaches, focal weakness or numbness.   ____________________________________________   PHYSICAL EXAM:  VITAL SIGNS:  ED Triage Vitals  Enc Vitals Group     BP 05/20/18 1540 (!) 141/80     Pulse Rate 05/20/18 1540 (!) 117     Resp 05/20/18 1556 16     Temp 05/20/18 1540 (!) 100.4 F (38 C)     Temp Source 05/20/18 1540 Oral     SpO2 05/20/18 1540 98 %     Weight 05/20/18 1541 145 lb (65.8 kg)     Height 05/20/18 1541 5\' 11"  (1.803 m)     Head Circumference --      Peak Flow --      Pain Score 05/20/18 1541 0     Pain Loc --      Pain Edu? --      Excl. in GC? --     Constitutional: Alert and oriented. Well appearing and in no acute distress.  Appears as if she does not feel very well but certainly does not appear toxic Eyes: Conjunctivae are normal Head: Atraumatic HEENT:  No congestion/rhinnorhea. Mucous membranes are moist.  Oropharynx non-erythematous Neck:   Nontender with no meningismus, no masses, no stridor Cardiovascular: Normal rate, regular rhythm. Grossly normal heart sounds.  Good peripheral circulation. Respiratory: Normal respiratory effort.  No retractions. Lungs CTAB. Abdominal: Soft and nontender. No distention. No guarding no rebound Back:  There is no focal tenderness or step off.  there is no midline tenderness there are no lesions noted. there is no flank pain left greater than right CVA tenderness Musculoskeletal: No lower extremity tenderness, no upper extremity tenderness. No joint effusions, no DVT signs strong distal pulses no edema Neurologic:  Normal speech and language. No gross focal neurologic deficits are appreciated.  Skin:  Skin is warm, dry and intact. No rash noted. Psychiatric: Mood and affect are normal. Speech and behavior are normal.  ____________________________________________   LABS (all labs ordered are listed, but only abnormal results are displayed)  Labs Reviewed  URINE CULTURE  PROCALCITONIN  CBC WITH DIFFERENTIAL/PLATELET  LACTIC ACID, PLASMA  LACTIC ACID, PLASMA  COMPREHENSIVE METABOLIC PANEL  URINALYSIS, COMPLETE (UACMP) WITH MICROSCOPIC    Pertinent labs  results that were available during my care of the patient were reviewed by me and considered in my medical decision making (see chart for details). ____________________________________________  EKG  I personally interpreted any EKGs ordered by me or triage Sinus mild tachycardia rate 103 no acute ST elevation or depression normal axis unremarkable EKG ____________________________________________  RADIOLOGY  Pertinent labs & imaging results that were available during my care of the patient were reviewed by me and considered in my medical decision making (see chart for details). If possible, patient and/or family made aware of any abnormal  findings.  No results found. ____________________________________________    PROCEDURES  Procedure(s) performed: None  Procedures  Critical Care performed: None  ____________________________________________   INITIAL IMPRESSION / ASSESSMENT AND PLAN / ED COURSE  Pertinent labs & imaging results that were available during my care of the patient were reviewed by me and considered in my medical decision making (see chart for details).  Patient here with a near syncopal event after spending a week in bed and standing up quickly.  He was told he was anemic but does not know the numbers and I cannot gain access to them that I can see in the computer.  He has had at least low-grade temperatures and decreased energy for the last week.  Certainly possible exist this could be coronavirus, however, the focal nature  of his low back pain makes me also concerned about urinary tract infection and/or infected kidney stone.  We will endeavor to work him up for these entities and reassess  ----------------------------------------- 7:28 PM on 05/20/2018 -----------------------------------------  CT scan does not show any evidence of any urinary pathology nor does his urine, his white count somewhat elevated which would be a little bit atypical for coronavirus but his lymphocyte count is now which again is and future catheter level is negative.  Chest x-ray shows a diffuse pathology possibly consistent with coronavirus, we have ordered that test.  We will forego IV antibiotics pending the results of that test.  Given diffused pulmonary involvement, patient is near syncopal event today he likely will need to be admitted to the hospital I would like to have a diagnosis prior to doing so.  Cultures are pending, he is in no acute distress.  He understands what we are doing.  Lactic acid is also negative meaning that his sepsis markers are very much normal.     ____________________________________________   FINAL CLINICAL IMPRESSION(S) / ED DIAGNOSES  Final diagnoses:  None      This chart was dictated using voice recognition software.  Despite best efforts to proofread,  errors can occur which can change meaning.      Jeanmarie Plant, MD 05/20/18 1616    Jeanmarie Plant, MD 05/20/18 1929

## 2018-05-20 NOTE — ED Notes (Signed)
Pt's mother called wanting update on pt. Pt stated he wanted to speak with mother. Given phone; pt updated mother.

## 2018-05-20 NOTE — ED Notes (Signed)
Call bell within reach; pt educated to use it for any needs. Bed in lowest position. Rails up.

## 2018-05-21 LAB — CBC WITH DIFFERENTIAL/PLATELET
Abs Immature Granulocytes: 0.07 10*3/uL (ref 0.00–0.07)
Basophils Absolute: 0 10*3/uL (ref 0.0–0.1)
Basophils Relative: 0 %
Eosinophils Absolute: 0.1 10*3/uL (ref 0.0–0.5)
Eosinophils Relative: 0 %
HCT: 31.8 % — ABNORMAL LOW (ref 39.0–52.0)
Hemoglobin: 10.6 g/dL — ABNORMAL LOW (ref 13.0–17.0)
Immature Granulocytes: 1 %
Lymphocytes Relative: 8 %
Lymphs Abs: 1 10*3/uL (ref 0.7–4.0)
MCH: 30.5 pg (ref 26.0–34.0)
MCHC: 33.3 g/dL (ref 30.0–36.0)
MCV: 91.4 fL (ref 80.0–100.0)
Monocytes Absolute: 0.4 10*3/uL (ref 0.1–1.0)
Monocytes Relative: 4 %
Neutro Abs: 10.6 10*3/uL — ABNORMAL HIGH (ref 1.7–7.7)
Neutrophils Relative %: 87 %
Platelets: 575 10*3/uL — ABNORMAL HIGH (ref 150–400)
RBC: 3.48 MIL/uL — ABNORMAL LOW (ref 4.22–5.81)
RDW: 12.1 % (ref 11.5–15.5)
WBC: 12.2 10*3/uL — ABNORMAL HIGH (ref 4.0–10.5)
nRBC: 0 % (ref 0.0–0.2)

## 2018-05-21 LAB — COMPREHENSIVE METABOLIC PANEL
ALT: 32 U/L (ref 0–44)
AST: 35 U/L (ref 15–41)
Albumin: 2.7 g/dL — ABNORMAL LOW (ref 3.5–5.0)
Alkaline Phosphatase: 54 U/L (ref 38–126)
Anion gap: 12 (ref 5–15)
BUN: 8 mg/dL (ref 6–20)
CO2: 24 mmol/L (ref 22–32)
Calcium: 8.3 mg/dL — ABNORMAL LOW (ref 8.9–10.3)
Chloride: 100 mmol/L (ref 98–111)
Creatinine, Ser: 0.55 mg/dL — ABNORMAL LOW (ref 0.61–1.24)
GFR calc Af Amer: >60 mL/min (ref >60)
GFR calc non Af Amer: >60 mL/min (ref >60)
Glucose, Bld: 92 mg/dL (ref 70–99)
Potassium: 3.4 mmol/L — ABNORMAL LOW (ref 3.5–5.1)
Sodium: 136 mmol/L (ref 135–145)
Total Bilirubin: 1.4 mg/dL — ABNORMAL HIGH (ref 0.3–1.2)
Total Protein: 7.4 g/dL (ref 6.5–8.1)

## 2018-05-21 LAB — RESPIRATORY PANEL BY PCR

## 2018-05-21 LAB — MRSA PCR SCREENING: MRSA by PCR: NEGATIVE

## 2018-05-21 LAB — SARS CORONAVIRUS 2 BY RT PCR (HOSPITAL ORDER, PERFORMED IN ~~LOC~~ HOSPITAL LAB): SARS Coronavirus 2: NEGATIVE

## 2018-05-21 LAB — ABO/RH: ABO/RH(D): A POS

## 2018-05-21 MED ORDER — VANCOMYCIN HCL 10 G IV SOLR
1250.0000 mg | Freq: Two times a day (BID) | INTRAVENOUS | Status: DC
  Administered 2018-05-21: 12:00:00 1250 mg via INTRAVENOUS
  Filled 2018-05-21 (×2): qty 1250

## 2018-05-21 MED ORDER — LEVOFLOXACIN IN D5W 750 MG/150ML IV SOLN
750.0000 mg | INTRAVENOUS | Status: DC
  Administered 2018-05-21 – 2018-05-22 (×2): 750 mg via INTRAVENOUS
  Filled 2018-05-21 (×3): qty 150

## 2018-05-21 MED ORDER — SODIUM CHLORIDE 0.9 % IV SOLN
INTRAVENOUS | Status: DC | PRN
  Administered 2018-05-21 – 2018-05-23 (×4): 250 mL via INTRAVENOUS

## 2018-05-21 NOTE — Plan of Care (Signed)
  Problem: Education: Goal: Knowledge of General Education information will improve Description: Including pain rating scale, medication(s)/side effects and non-pharmacologic comfort measures Outcome: Progressing   Problem: Clinical Measurements: Goal: Diagnostic test results will improve Outcome: Progressing   Problem: Activity: Goal: Risk for activity intolerance will decrease Outcome: Progressing   

## 2018-05-21 NOTE — Progress Notes (Signed)
[facility] - [place] at [facility]   PATIENT NAME: Roy Torres    MR#:  [id number]  DATE OF BIRTH:  1995/05/25  SUBJECTIVE:  CHIEF COMPLAINT:   Chief Complaint  Patient presents with  . Loss of Consciousness   Still has dry cough. Febrile. Myalgias. No diarrhea.  REVIEW OF SYSTEMS:    Review of Systems  Constitutional: Positive for chills, fever and malaise/fatigue.  HENT: Negative for sore throat.   Eyes: Negative for blurred vision, double vision and pain.  Respiratory: Positive for cough and shortness of breath. Negative for hemoptysis and wheezing.   Cardiovascular: Negative for chest pain, palpitations, orthopnea and leg swelling.  Gastrointestinal: Negative for abdominal pain, constipation, diarrhea, heartburn, nausea and vomiting.  Genitourinary: Negative for dysuria and hematuria.  Musculoskeletal: Positive for myalgias. Negative for back pain and joint pain.  Skin: Negative for rash.  Neurological: Negative for sensory change, speech change, focal weakness and headaches.  Endo/Heme/Allergies: Does not bruise/bleed easily.  Psychiatric/Behavioral: Negative for depression. The patient is not nervous/anxious.     DRUG ALLERGIES:  No Known Allergies  VITALS:  Blood pressure (!) 114/59, pulse 95, temperature 99.9 F (37.7 C), temperature source Oral, resp. rate 20, height 5\' 11"  (1.803 m), weight 65.8 kg, SpO2 96 %.  PHYSICAL EXAMINATION:   Physical Exam  GENERAL:  23 y.o.-year-old patient lying in the bed with no acute distress.  EYES: Pupils equal, round, reactive to light and accommodation. No scleral icterus. Extraocular muscles intact.  HEENT: Head atraumatic, normocephalic. Oropharynx and nasopharynx clear.  NECK:  Supple, no jugular venous distention. No thyroid enlargement, no tenderness.  LUNGS: Normal breath sounds bilaterally, no wheezing, rales, rhonchi. No use of accessory muscles of respiration.  CARDIOVASCULAR: S1, S2 normal. No  murmurs, rubs, or gallops.  ABDOMEN: Soft, nontender, nondistended. Bowel sounds present. No organomegaly or mass.  EXTREMITIES: No cyanosis, clubbing or edema b/l.    NEUROLOGIC: Cranial nerves II through XII are intact. No focal Motor or sensory deficits b/l.   PSYCHIATRIC: The patient is alert and oriented x 3.  SKIN: No obvious rash, lesion, or ulcer.   LABORATORY PANEL:   CBC Recent Labs  Lab 05/21/18 0632  WBC 12.2*  HGB 10.6*  HCT 31.8*  PLT 575*   ------------------------------------------------------------------------------------------------------------------ Chemistries  Recent Labs  Lab 05/21/18 0632  NA 136  K 3.4*  CL 100  CO2 24  GLUCOSE 92  BUN 8  CREATININE 0.55*  CALCIUM 8.3*  AST 35  ALT 32  ALKPHOS 54  BILITOT 1.4*   ------------------------------------------------------------------------------------------------------------------  Cardiac Enzymes No results for input(s): TROPONINI in the last 168 hours. ------------------------------------------------------------------------------------------------------------------  RADIOLOGY:  Dg Chest Port 1 View  Result Date: 05/20/2018 CLINICAL DATA:  Syncope EXAM: PORTABLE CHEST 1 VIEW COMPARISON:  None. FINDINGS: There is diffuse reticulonodular interstitial disease throughout the lungs bilaterally. There is no appreciable frank airspace consolidation. Heart size and pulmonary vascularity are normal. No adenopathy. No bone lesions. 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 Question atypical infection as a potential etiology for this widespread finding. There is no airspace consolidation. Heart size and pulmonary vascularity are normal. No adenopathy. Electronically Signed   By: Bretta BangWilliam  Woodruff III M.D.   On: 05/20/2018 16:34   Ct Renal Stone Study  Result Date: 05/20/2018 CLINICAL DATA:  Bilateral flank pain and low-grade fever. EXAM: CT ABDOMEN AND PELVIS WITHOUT  CONTRAST TECHNIQUE: Multidetector CT imaging of the abdomen and pelvis was performed following the standard protocol without IV contrast.  COMPARISON:  CT 06/03/2011 FINDINGS: Lower chest: Widespread pulmonary density which is interstitial and miliary consistent with acute widespread pneumonia. This could be viral or due to other atypical organisms. No pleural effusion. Hepatobiliary: Normal Pancreas: Normal Spleen: Normal Adrenals/Urinary Tract: Adrenal glands are normal. Kidneys are normal. No cyst, mass, stone or hydronephrosis. Bladder is normal. Stomach/Bowel: No abnormal bowel finding.  Previous appendectomy. Vascular/Lymphatic: Normal Reproductive: Normal Other: No free fluid or air. Musculoskeletal: Normal IMPRESSION: Widespread pulmonary abnormality with interstitial prominence and miliary type airspace filling. This pattern is consistent with acute widespread pneumonia and could be due to atypical organisms such as viral pneumonia or tuberculosis. No pleural effusions. No evidence of urinary tract stone disease. No abdominal or pelvic pathology visible by CT. Previous appendectomy. Electronically Signed   By: Paulina Fusi M.D.   On: 05/20/2018 16:37     ASSESSMENT AND PLAN:    * Sepsis secondary to atypical pneumonia On IV abx COVID negative But due to high suspicion I ordered repeat testing Blood cx pending  * Hypokalemia Replace PO  * Normocytic anemia Monitor No bleeding  All the records are reviewed and case discussed with Care Management/Social Worker Management plans discussed with the patient, family and they are in agreement.  CODE STATUS: FULL CODE  DVT Prophylaxis: SCDs  TOTAL TIME TAKING CARE OF THIS PATIENT: 35 minutes.   POSSIBLE D/C IN 1-2 DAYS, DEPENDING ON CLINICAL CONDITION.  Orie Fisherman M.D on 05/21/2018 at 10:58 AM  Between 7am to 6pm - Pager - [phone]  After 6pm go to [email or web address] - password [email or web address]  [facility] [place] [facility]  Office   [phone]  CC: Primary care physician; Patient, No Pcp Per  Note: This dictation was prepared with Dragon dictation along with smaller phrase technology. Any transcriptional errors that result from this process are unintentional.

## 2018-05-22 LAB — CBC WITH DIFFERENTIAL/PLATELET
Abs Immature Granulocytes: 0.05 10*3/uL (ref 0.00–0.07)
Basophils Absolute: 0 10*3/uL (ref 0.0–0.1)
Basophils Relative: 0 %
Eosinophils Absolute: 0 10*3/uL (ref 0.0–0.5)
Eosinophils Relative: 0 %
HCT: 33.3 % — ABNORMAL LOW (ref 39.0–52.0)
Hemoglobin: 11.2 g/dL — ABNORMAL LOW (ref 13.0–17.0)
Immature Granulocytes: 0 %
Lymphocytes Relative: 7 %
Lymphs Abs: 0.8 10*3/uL (ref 0.7–4.0)
MCH: 30.2 pg (ref 26.0–34.0)
MCHC: 33.6 g/dL (ref 30.0–36.0)
MCV: 89.8 fL (ref 80.0–100.0)
Monocytes Absolute: 0.4 10*3/uL (ref 0.1–1.0)
Monocytes Relative: 4 %
Neutro Abs: 10.2 10*3/uL — ABNORMAL HIGH (ref 1.7–7.7)
Neutrophils Relative %: 89 %
Platelets: 616 10*3/uL — ABNORMAL HIGH (ref 150–400)
RBC: 3.71 MIL/uL — ABNORMAL LOW (ref 4.22–5.81)
RDW: 12 % (ref 11.5–15.5)
WBC: 11.5 10*3/uL — ABNORMAL HIGH (ref 4.0–10.5)
nRBC: 0 % (ref 0.0–0.2)

## 2018-05-22 LAB — COMPREHENSIVE METABOLIC PANEL
ALT: 39 U/L (ref 0–44)
AST: 37 U/L (ref 15–41)
Albumin: 2.9 g/dL — ABNORMAL LOW (ref 3.5–5.0)
Alkaline Phosphatase: 67 U/L (ref 38–126)
Anion gap: 12 (ref 5–15)
BUN: 7 mg/dL (ref 6–20)
CO2: 26 mmol/L (ref 22–32)
Calcium: 8.7 mg/dL — ABNORMAL LOW (ref 8.9–10.3)
Chloride: 99 mmol/L (ref 98–111)
Creatinine, Ser: 0.6 mg/dL — ABNORMAL LOW (ref 0.61–1.24)
GFR calc Af Amer: >60 mL/min (ref >60)
GFR calc non Af Amer: >60 mL/min (ref >60)
Glucose, Bld: 100 mg/dL — ABNORMAL HIGH (ref 70–99)
Potassium: 3.6 mmol/L (ref 3.5–5.1)
Sodium: 137 mmol/L (ref 135–145)
Total Bilirubin: 1.5 mg/dL — ABNORMAL HIGH (ref 0.3–1.2)
Total Protein: 8 g/dL (ref 6.5–8.1)

## 2018-05-22 LAB — URINE CULTURE: Culture: NO GROWTH

## 2018-05-22 MED ORDER — ENSURE MAX PROTEIN PO LIQD
11.0000 [oz_av] | Freq: Two times a day (BID) | ORAL | Status: DC
  Administered 2018-05-22 – 2018-05-23 (×2): 11 [oz_av] via ORAL
  Filled 2018-05-22: qty 330

## 2018-05-22 MED ORDER — ADULT MULTIVITAMIN W/MINERALS CH
1.0000 | ORAL_TABLET | Freq: Every day | ORAL | Status: DC
  Administered 2018-05-23: 1 via ORAL
  Filled 2018-05-22: qty 1

## 2018-05-22 MED ORDER — SODIUM CHLORIDE 0.9% FLUSH
10.0000 mL | Freq: Two times a day (BID) | INTRAVENOUS | Status: DC
  Administered 2018-05-22 – 2018-05-23 (×2): 10 mL via INTRAVENOUS

## 2018-05-22 NOTE — Progress Notes (Signed)
Initial Nutrition Assessment  RD working remotely.  DOCUMENTATION CODES:   Not applicable  INTERVENTION:  Provide Ensure Max Protein po BID, each supplement provides 150 kcal and 30 grams of protein.  Provide daily MVI.  Encouraged adequate intake of calories and protein at meals.  NUTRITION DIAGNOSIS:   Inadequate oral intake related to decreased appetite as evidenced by per patient/family report.  GOAL:   Patient will meet greater than or equal to 90% of their needs  MONITOR:   PO intake, Supplement acceptance, Labs, Weight trends, I & O's  REASON FOR ASSESSMENT:   Malnutrition Screening Tool    ASSESSMENT:   23 year old male with PMHx of anemia admitted with sepsis secondary to atypical PNA, found to be negative for COVID-19.   Spoke with patient over the phone to obtain nutrition/weight history. He reports he had a decreased appetite/intake for about one week. He was still attempting to eat 2-3 meals per day but was eating less at meals. Unable to provide exact details. Patient reports his appetite is starting to improve now. He ate 100% of breakfast this morning. He is amenable to drinking ONS to help meet calorie/protein needs.  Patient reports his UBW was 165-170 lbs and that he has lost down to 145 lbs over an unknown time frame. This is a concerning amount of weight loss, but unable to determine significance without time frame. Also unsure if current weight is accurate as it appears reported.  Medications reviewed and include: Levaquin.  Labs reviewed.  Patient is at risk for malnutrition in setting of reported weight loss. However, unable to determine if patients meets criteria for malnutrition without NFPE.  NUTRITION - FOCUSED PHYSICAL EXAM:  Unable to complete at this time.  Diet Order:   Diet Order            Diet regular Room service appropriate? Yes; Fluid consistency: Thin  Diet effective now             EDUCATION NEEDS:   Education needs  have been addressed  Skin:  Skin Assessment: Reviewed RN Assessment  Last BM:  05/19/2018 per chart  Height:   Ht Readings from Last 1 Encounters:  05/20/18 5\' 11"  (1.803 m)   Weight:   Wt Readings from Last 1 Encounters:  05/20/18 65.8 kg   Ideal Body Weight:  78.2 kg  BMI:  Body mass index is 20.22 kg/m.  Estimated Nutritional Needs:   Kcal:  2000-2200  Protein:  100-110 grams  Fluid:  2-2.2 L/day  Helane Rima, MS, RD, LDN Office: [phone] Pager: [phone] After Hours/Weekend Pager: [phone]

## 2018-05-22 NOTE — Progress Notes (Signed)
[facility] [place] [facility]   PATIENT NAME: Roy Torres    MR#:  [id number]  DATE OF BIRTH:  08-31-1995  SUBJECTIVE:  CHIEF COMPLAINT:   Chief Complaint  Patient presents with  . Loss of Consciousness   Feels better.  But continues to have febrile episodes.  100.7 today. Dry cough.  Patient tells me he vapes.  REVIEW OF SYSTEMS:    Review of Systems  Constitutional: Positive for chills, fever and malaise/fatigue.  HENT: Negative for sore throat.   Eyes: Negative for blurred vision, double vision and pain.  Respiratory: Positive for cough and shortness of breath. Negative for hemoptysis and wheezing.   Cardiovascular: Negative for chest pain, palpitations, orthopnea and leg swelling.  Gastrointestinal: Negative for abdominal pain, constipation, diarrhea, heartburn, nausea and vomiting.  Genitourinary: Negative for dysuria and hematuria.  Musculoskeletal: Positive for myalgias. Negative for back pain and joint pain.  Skin: Negative for rash.  Neurological: Negative for sensory change, speech change, focal weakness and headaches.  Endo/Heme/Allergies: Does not bruise/bleed easily.  Psychiatric/Behavioral: Negative for depression. The patient is not nervous/anxious.     DRUG ALLERGIES:  No Known Allergies  VITALS:  Blood pressure 138/80, pulse 96, temperature 99.3 F (37.4 C), temperature source Oral, resp. rate 18, height 5\' 11"  (1.803 m), weight 65.8 kg, SpO2 98 %.  PHYSICAL EXAMINATION:   Physical Exam  GENERAL:  23 y.o.-year-old patient lying in the bed with no acute distress.  EYES: Pupils equal, round, reactive to light and accommodation. No scleral icterus. Extraocular muscles intact.  HEENT: Head atraumatic, normocephalic. Oropharynx and nasopharynx clear.  NECK:  Supple, no jugular venous distention. No thyroid enlargement, no tenderness.  LUNGS: Normal breath sounds bilaterally, no wheezing, rales, rhonchi. No use of accessory muscles of  respiration.  CARDIOVASCULAR: S1, S2 normal. No murmurs, rubs, or gallops.  ABDOMEN: Soft, nontender, nondistended. Bowel sounds present. No organomegaly or mass.  EXTREMITIES: No cyanosis, clubbing or edema b/l.    NEUROLOGIC: Cranial nerves II through XII are intact. No focal Motor or sensory deficits b/l.   PSYCHIATRIC: The patient is alert and oriented x 3.  SKIN: No obvious rash, lesion, or ulcer.   LABORATORY PANEL:   CBC Recent Labs  Lab 05/22/18 0642  WBC 11.5*  HGB 11.2*  HCT 33.3*  PLT 616*   ------------------------------------------------------------------------------------------------------------------ Chemistries  Recent Labs  Lab 05/22/18 0642  NA 137  K 3.6  CL 99  CO2 26  GLUCOSE 100*  BUN 7  CREATININE 0.60*  CALCIUM 8.7*  AST 37  ALT 39  ALKPHOS 67  BILITOT 1.5*   ------------------------------------------------------------------------------------------------------------------  Cardiac Enzymes No results for input(s): TROPONINI in the last 168 hours. ------------------------------------------------------------------------------------------------------------------  RADIOLOGY:  Dg Chest Port 1 View  Result Date: 05/20/2018 CLINICAL DATA:  Syncope EXAM: PORTABLE CHEST 1 VIEW COMPARISON:  None. FINDINGS: There is diffuse reticulonodular interstitial disease throughout the lungs bilaterally. There is no appreciable frank airspace consolidation. Heart size and pulmonary vascularity are normal. No adenopathy. No bone lesions. 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 Question atypical infection as a potential etiology for this widespread finding. There is no airspace consolidation. Heart size and pulmonary vascularity are normal. No adenopathy. Electronically Signed   By: Bretta Bang III M.D.   On: 05/20/2018 16:34   Ct Renal Stone Study  Result Date: 05/20/2018 CLINICAL DATA:  Bilateral flank pain and low-grade  fever. EXAM: CT ABDOMEN AND PELVIS WITHOUT CONTRAST TECHNIQUE: Multidetector CT imaging of the abdomen  and pelvis was performed following the standard protocol without IV contrast. COMPARISON:  CT 06/03/2011 FINDINGS: Lower chest: Widespread pulmonary density which is interstitial and miliary consistent with acute widespread pneumonia. This could be viral or due to other atypical organisms. No pleural effusion. Hepatobiliary: Normal Pancreas: Normal Spleen: Normal Adrenals/Urinary Tract: Adrenal glands are normal. Kidneys are normal. No cyst, mass, stone or hydronephrosis. Bladder is normal. Stomach/Bowel: No abnormal bowel finding.  Previous appendectomy. Vascular/Lymphatic: Normal Reproductive: Normal Other: No free fluid or air. Musculoskeletal: Normal IMPRESSION: Widespread pulmonary abnormality with interstitial prominence and miliary type airspace filling. This pattern is consistent with acute widespread pneumonia and could be due to atypical organisms such as viral pneumonia or tuberculosis. No pleural effusions. No evidence of urinary tract stone disease. No abdominal or pelvic pathology visible by CT. Previous appendectomy. Electronically Signed   By: Paulina FusiMark  Shogry M.D.   On: 05/20/2018 16:37     ASSESSMENT AND PLAN:    * Sepsis secondary to atypical pneumonia On IV abx.  Levaquin COVID negative But due to high suspicion I ordered repeat testing and was negative Blood cx pending  CT scan and x-ray changes could be due to vaping.  [facility]  Advised with 2- tests coronavirus unlikely.  Discontinued isolation.  Check HIV and Levaquin  * Hypokalemia Replace PO  * Normocytic anemia Monitor No bleeding  All the records are reviewed and case discussed with Care Management/Social Worker Management plans discussed with the patient, family and they are in agreement.  CODE STATUS: FULL CODE  DVT Prophylaxis: SCDs  TOTAL TIME TAKING CARE OF THIS  PATIENT: 35 minutes.   POSSIBLE D/C IN 1-2 DAYS, DEPENDING ON CLINICAL CONDITION.  Orie FishermanSrikar R Arbor Leer M.D on 05/22/2018 at 10:51 AM  Between 7am to 6pm - Pager - [phone]  After 6pm go to [email or web address] - password [email or web address]  [facility] [place] [facility]  Office  [phone]  CC: Primary care physician; Patient, No Pcp Per  Note: This dictation was prepared with Dragon dictation along with smaller phrase technology. Any transcriptional errors that result from this process are unintentional.

## 2018-05-23 LAB — COMPREHENSIVE METABOLIC PANEL
ALT: 39 U/L (ref 0–44)
AST: 38 U/L (ref 15–41)
Albumin: 2.7 g/dL — ABNORMAL LOW (ref 3.5–5.0)
Alkaline Phosphatase: 64 U/L (ref 38–126)
Anion gap: 13 (ref 5–15)
BUN: 9 mg/dL (ref 6–20)
CO2: 25 mmol/L (ref 22–32)
Calcium: 8.6 mg/dL — ABNORMAL LOW (ref 8.9–10.3)
Chloride: 100 mmol/L (ref 98–111)
Creatinine, Ser: 0.56 mg/dL — ABNORMAL LOW (ref 0.61–1.24)
GFR calc Af Amer: >60 mL/min (ref >60)
GFR calc non Af Amer: >60 mL/min (ref >60)
Glucose, Bld: 90 mg/dL (ref 70–99)
Potassium: 3.2 mmol/L — ABNORMAL LOW (ref 3.5–5.1)
Sodium: 138 mmol/L (ref 135–145)
Total Bilirubin: 1 mg/dL (ref 0.3–1.2)
Total Protein: 7.9 g/dL (ref 6.5–8.1)

## 2018-05-23 LAB — CBC WITH DIFFERENTIAL/PLATELET
Abs Immature Granulocytes: 0.05 10*3/uL (ref 0.00–0.07)
Basophils Absolute: 0 10*3/uL (ref 0.0–0.1)
Basophils Relative: 0 %
Eosinophils Absolute: 0.1 10*3/uL (ref 0.0–0.5)
Eosinophils Relative: 1 %
HCT: 32.7 % — ABNORMAL LOW (ref 39.0–52.0)
Hemoglobin: 11 g/dL — ABNORMAL LOW (ref 13.0–17.0)
Immature Granulocytes: 1 %
Lymphocytes Relative: 12 %
Lymphs Abs: 1.3 10*3/uL (ref 0.7–4.0)
MCH: 30.1 pg (ref 26.0–34.0)
MCHC: 33.6 g/dL (ref 30.0–36.0)
MCV: 89.3 fL (ref 80.0–100.0)
Monocytes Absolute: 0.5 10*3/uL (ref 0.1–1.0)
Monocytes Relative: 4 %
Neutro Abs: 8.9 10*3/uL — ABNORMAL HIGH (ref 1.7–7.7)
Neutrophils Relative %: 82 %
Platelets: 639 10*3/uL — ABNORMAL HIGH (ref 150–400)
RBC: 3.66 MIL/uL — ABNORMAL LOW (ref 4.22–5.81)
RDW: 12.1 % (ref 11.5–15.5)
WBC: 10.8 10*3/uL — ABNORMAL HIGH (ref 4.0–10.5)
nRBC: 0 % (ref 0.0–0.2)

## 2018-05-23 MED ORDER — POTASSIUM CHLORIDE CRYS ER 20 MEQ PO TBCR
40.0000 meq | EXTENDED_RELEASE_TABLET | Freq: Once | ORAL | Status: AC
  Administered 2018-05-23: 40 meq via ORAL
  Filled 2018-05-23: qty 2

## 2018-05-23 MED ORDER — LEVOFLOXACIN 750 MG PO TABS: 750.0000 mg | ORAL_TABLET | Freq: Every day | ORAL | 0 refills | Status: DC

## 2018-05-23 MED ORDER — ALBUTEROL SULFATE HFA 108 (90 BASE) MCG/ACT IN AERS: 2.0000 | INHALATION_SPRAY | Freq: Four times a day (QID) | RESPIRATORY_TRACT | 0 refills | Status: DC | PRN

## 2018-05-23 NOTE — Progress Notes (Signed)
Ch spoke w/ pt via telephone. Pt shared that his mom brought him in when he passed out at home. Pt stated that he is feeling better now but did vomit after taking supplements that made him nauseous. Pt seemed to be tired but pt shared that he was stable and ready to be d/c. Ch provided words of encouragement. No further needs at this time.    05/23/18 1045  Clinical Encounter Type  Visited With Patient  Visit Type Psychological support;Spiritual support;Social support  Spiritual Encounters  Spiritual Needs Emotional;Grief support  Stress Factors  Patient Stress Factors Loss of control;Major life changes;Health changes  Family Stress Factors Major life changes

## 2018-05-23 NOTE — Progress Notes (Signed)
Patient given discharge instructions by [facility] nurse Brandi. IV taken out. Patient being picked up by family member.

## 2018-05-23 NOTE — Discharge Instructions (Signed)
Resume diet and activity as before ° ° °

## 2018-05-23 NOTE — Plan of Care (Signed)
  Problem: Education: Goal: Knowledge of General Education information will improve Description Including pain rating scale, medication(s)/side effects and non-pharmacologic comfort measures Outcome: Progressing   Problem: Nutrition: Goal: Adequate nutrition will be maintained Outcome: Progressing   Problem: Fluid Volume: Goal: Hemodynamic stability will improve Outcome: Progressing   Problem: Clinical Measurements: Goal: Diagnostic test results will improve Outcome: Progressing Goal: Signs and symptoms of infection will decrease Outcome: Progressing  Patient is still on IV antibiotics.

## 2018-05-23 NOTE — Plan of Care (Signed)

## 2018-05-24 LAB — HIV ANTIBODY (ROUTINE TESTING W REFLEX): HIV Screen 4th Generation wRfx: NONREACTIVE

## 2018-05-25 LAB — CULTURE, BLOOD (ROUTINE X 2)
Culture: NO GROWTH
Culture: NO GROWTH
Special Requests: ADEQUATE

## 2018-05-27 NOTE — Discharge Summary (Signed)
[facility] - [place] at [facility]   PATIENT NAME: Roy Torres    MR#:  [id number]  DATE OF BIRTH:  1995-11-16  DATE OF ADMISSION:  05/20/2018 ADMITTING PHYSICIAN: Oralia Manis, MD  DATE OF DISCHARGE: 05/23/2018 12:00 PM  PRIMARY CARE PHYSICIAN: Patient, No Pcp Per   ADMISSION DIAGNOSIS:  Chronic pneumonia [J18.9] Near syncope [R55]  DISCHARGE DIAGNOSIS:  Principal Problem:   Sepsis (HCC) Active Problems:   Atypical pneumonia   SECONDARY DIAGNOSIS:   Past Medical History:  Diagnosis Date  . Anemia      ADMITTING HISTORY  HISTORY OF PRESENT ILLNESS:  Roy Torres  is a 23 y.o. male who presents with chief complaint as above.  Patient presents to the ED with 1 week of intermittent fever, and a syncopal episode today.  Here in the ED he was found to meet sepsis criteria with tachycardia, fever, leukocytosis.  His chest x-ray shows bilateral reticulonodular infiltrate, consistent with atypical infection.  On his differential he is lymphopenic, and his procalcitonin was less than 0.1.  All this raises strong suspicion for possible novel coronavirus infection.  Swab was sent tonight and resulted initially negative.  Hospitalist were called for admission.   HOSPITAL COURSE:   *Sepsis secondary to atypical pneumonia On IV abx.  Levaquin in the hospital and switch to oral at discharge COVID negative Cultures negative. Initial COVID test was negative but due to high suspicion repeat COVID test was checked which was negative.  Isolation was discontinued at that point.  CT scan and x-ray changes could be due to vaping.  Discussed with infectious disease at [facility].  Advised with 2- tests coronavirus unlikely.  Discontinued isolation.   HIV checked and negative.  At home in stable condition with antibiotics, albuterol inhaler  * Hypokalemia Replaced PO  * Normocytic anemia Monitor No bleeding   Stable for discharge home.  Work note given. CONSULTS  OBTAINED:    DRUG ALLERGIES:  No Known Allergies  DISCHARGE MEDICATIONS:   Allergies as of 05/23/2018   No Known Allergies     Medication List    STOP taking these medications   cephALEXin 500 MG capsule Commonly known as:  KEFLEX     TAKE these medications   albuterol 108 (90 Base) MCG/ACT inhaler Commonly known as:  VENTOLIN HFA Inhale 2 puffs into the lungs every 6 (six) hours as needed for wheezing or shortness of breath.   levofloxacin 750 MG tablet Commonly known as:  Levaquin Take 1 tablet (750 mg total) by mouth daily.       Today   VITAL SIGNS:  Blood pressure 126/82, pulse 91, temperature 98.4 F (36.9 C), temperature source Oral, resp. rate 18, height 5\' 11"  (1.803 m), weight 65.8 kg, SpO2 99 %.  I/O:  No intake or output data in the 24 hours ending 05/27/18 2045  PHYSICAL EXAMINATION:  Physical Exam  GENERAL:  23 y.o.-year-old patient lying in the bed with no acute distress.  LUNGS: Normal breath sounds bilaterally, no wheezing, rales,rhonchi or crepitation. No use of accessory muscles of respiration.  CARDIOVASCULAR: S1, S2 normal. No murmurs, rubs, or gallops.  ABDOMEN: Soft, non-tender, non-distended. Bowel sounds present. No organomegaly or mass.  NEUROLOGIC: Moves all 4 extremities. PSYCHIATRIC: The patient is alert and oriented x 3.  SKIN: No obvious rash, lesion, or ulcer.   DATA REVIEW:   CBC Recent Labs  Lab 05/23/18 0518  WBC 10.8*  HGB 11.0*  HCT 32.7*  PLT 639*  Chemistries  Recent Labs  Lab 05/23/18 0518  NA 138  K 3.2*  CL 100  CO2 25  GLUCOSE 90  BUN 9  CREATININE 0.56*  CALCIUM 8.6*  AST 38  ALT 39  ALKPHOS 64  BILITOT 1.0    Cardiac Enzymes No results for input(s): TROPONINI in the last 168 hours.  Microbiology Results  Results for orders placed or performed during the hospital encounter of 05/20/18  Urine culture     Status: None   Collection Time: 05/20/18  6:09 PM  Result Value Ref Range Status    Specimen Description   Final    URINE, RANDOM Performed at [facility], [address] [place], [place] [address]    Special Requests   Final    NONE Performed at [facility], [address] [place], [place] [address]    Culture   Final    NO GROWTH Performed at [facility], [address] [place], [place] [address]    Report Status 05/22/2018 FINAL  Final  SARS Coronavirus 2 [facility](Hospital order, Performed in [facility] hospital lab)     Status: None   Collection Time: 05/20/18  6:09 PM  Result Value Ref Range Status   SARS Coronavirus 2 NEGATIVE NEGATIVE Final    Comment: (NOTE) If result is NEGATIVE SARS-CoV-2 target nucleic acids are NOT DETECTED. The SARS-CoV-2 RNA is generally detectable in upper and lower  respiratory specimens during the acute phase of infection. The lowest  concentration of SARS-CoV-2 viral copies this assay can detect is 250  copies / mL. A negative result does not preclude SARS-CoV-2 infection  and should not be used as the sole basis for treatment or other  patient management decisions.  A negative result may occur with  improper specimen collection / handling, submission of specimen other  than nasopharyngeal swab, presence of viral mutation(s) within the  areas targeted by this assay, and inadequate number of viral copies  (<250 copies / mL). A negative result must be combined with clinical  observations, patient history, and epidemiological information. If result is POSITIVE SARS-CoV-2 target nucleic acids are DETECTED. The SARS-CoV-2 RNA is generally detectable in upper and lower  respiratory specimens dur ing the acute phase of infection.  Positive  results are indicative of active infection with SARS-CoV-2.  Clinical  correlation with patient history and other diagnostic information is  necessary to determine patient infection status.  Positive results do  not rule out bacterial infection or co-infection with other  viruses. If result is PRESUMPTIVE POSTIVE SARS-CoV-2 nucleic acids MAY BE PRESENT.   A presumptive positive result was obtained on the submitted specimen  and confirmed on repeat testing.  While 2019 novel coronavirus  (SARS-CoV-2) nucleic acids may be present in the submitted sample  additional confirmatory testing may be necessary for epidemiological  and / or clinical management purposes  to differentiate between  SARS-CoV-2 and other Sarbecovirus currently known to infect humans.  If clinically indicated additional testing with an alternate test  methodology [phone]([id number]) is advised. The SARS-CoV-2 RNA is generally  detectable in upper and lower respiratory sp ecimens during the acute  phase of infection. The expected result is Negative. Fact Sheet for Patients:  BoilerBrush.com.cyhttps://www.fda.gov/media/136312/download Fact Sheet for Healthcare Providers: https://pope.com/https://www.fda.gov/media/136313/download This test is not yet approved or cleared by the [place] FDA and has been authorized for detection and/or diagnosis of SARS-CoV-2 by FDA under an Emergency Use Authorization (EUA).  This EUA will remain in  effect (meaning this test can be used) for the duration of the COVID-19 declaration under Section 564(b)(1) of the Act, 21 U.S.C. section 360bbb-3(b)(1), unless the authorization is terminated or revoked sooner. Performed at [facility], [address] [place], [place] [address]   Culture, blood (routine x 2)     Status: None   Collection Time: 05/20/18  6:09 PM  Result Value Ref Range Status   Specimen Description BLOOD BLOOD RIGHT FOREARM  Final   Special Requests   Final    BOTTLES DRAWN AEROBIC AND ANAEROBIC Blood Culture adequate volume   Culture   Final    NO GROWTH 5 DAYS Performed at [facility], [address] [place], [place] [address]    Report Status 05/25/2018 FINAL  Final  Culture, blood (routine x 2)     Status: None   Collection Time: 05/20/18  6:09 PM   Result Value Ref Range Status   Specimen Description BLOOD BLOOD LEFT FOREARM  Final   Special Requests   Final    BOTTLES DRAWN AEROBIC AND ANAEROBIC Blood Culture results may not be optimal due to an excessive volume of blood received in culture bottles   Culture   Final    NO GROWTH 5 DAYS Performed at [facility], [address] [place], [place] [address]    Report Status 05/25/2018 FINAL  Final  Respiratory Panel by PCR     Status: None   Collection Time: 05/20/18  6:09 PM  Result Value Ref Range Status   Adenovirus NOT DETECTED NOT DETECTED Final   Coronavirus 229E NOT DETECTED NOT DETECTED Final    Comment: (NOTE) The Coronavirus on the Respiratory Panel, DOES NOT test for the novel  Coronavirus (2019 nCoV)    Coronavirus HKU1 NOT DETECTED NOT DETECTED Final   Coronavirus NL63 NOT DETECTED NOT DETECTED Final   Coronavirus OC43 NOT DETECTED NOT DETECTED Final   Metapneumovirus NOT DETECTED NOT DETECTED Final   Rhinovirus / Enterovirus NOT DETECTED NOT DETECTED Final   Influenza A NOT DETECTED NOT DETECTED Final   Influenza B NOT DETECTED NOT DETECTED Final   Parainfluenza Virus 1 NOT DETECTED NOT DETECTED Final   Parainfluenza Virus 2 NOT DETECTED NOT DETECTED Final   Parainfluenza Virus 3 NOT DETECTED NOT DETECTED Final   Parainfluenza Virus 4 NOT DETECTED NOT DETECTED Final   Respiratory Syncytial Virus NOT DETECTED NOT DETECTED Final   Bordetella pertussis NOT DETECTED NOT DETECTED Final   Chlamydophila pneumoniae NOT DETECTED NOT DETECTED Final   Mycoplasma pneumoniae NOT DETECTED NOT DETECTED Final    Comment: Performed at [facility], [address] [place], [place] [address]  SARS Coronavirus 2 [facility] order, Performed in [facility] hospital lab)     Status: None   Collection Time: 05/21/18  7:33 AM  Result Value Ref Range Status   SARS Coronavirus 2 NEGATIVE NEGATIVE Final    Comment: (NOTE) If result is NEGATIVE SARS-CoV-2 target nucleic  acids are NOT DETECTED. The SARS-CoV-2 RNA is generally detectable in upper and lower  respiratory specimens during the acute phase of infection. The lowest  concentration of SARS-CoV-2 viral copies this assay can detect is 250  copies / mL. A negative result does not preclude SARS-CoV-2 infection  and should not be used as the sole basis for treatment or other  patient management decisions.  A negative result may occur with  improper specimen collection / handling, submission of specimen other  than nasopharyngeal swab,  presence of viral mutation(s) within the  areas targeted by this assay, and inadequate number of viral copies  (<250 copies / mL). A negative result must be combined with clinical  observations, patient history, and epidemiological information. If result is POSITIVE SARS-CoV-2 target nucleic acids are DETECTED. The SARS-CoV-2 RNA is generally detectable in upper and lower  respiratory specimens dur ing the acute phase of infection.  Positive  results are indicative of active infection with SARS-CoV-2.  Clinical  correlation with patient history and other diagnostic information is  necessary to determine patient infection status.  Positive results do  not rule out bacterial infection or co-infection with other viruses. If result is PRESUMPTIVE POSTIVE SARS-CoV-2 nucleic acids MAY BE PRESENT.   A presumptive positive result was obtained on the submitted specimen  and confirmed on repeat testing.  While 2019 novel coronavirus  (SARS-CoV-2) nucleic acids may be present in the submitted sample  additional confirmatory testing may be necessary for epidemiological  and / or clinical management purposes  to differentiate between  SARS-CoV-2 and other Sarbecovirus currently known to infect humans.  If clinically indicated additional testing with an alternate test  methodology [phone]) is advised. The SARS-CoV-2 RNA is generally  detectable in upper and lower respiratory  sp ecimens during the acute  phase of infection. The expected result is Negative. Fact Sheet for Patients:  BoilerBrush.com.cy Fact Sheet for Healthcare Providers: https://pope.com/ This test is not yet approved or cleared by the [place] FDA and has been authorized for detection and/or diagnosis of SARS-CoV-2 by FDA under an Emergency Use Authorization (EUA).  This EUA will remain in effect (meaning this test can be used) for the duration of the COVID-19 declaration under Section 564(b)(1) of the Act, 21 U.S.C. section 360bbb-3(b)(1), unless the authorization is terminated or revoked sooner. Performed at [facility], [address] [place], [place] [address]   MRSA PCR Screening     Status: None   Collection Time: 05/21/18 10:13 AM  Result Value Ref Range Status   MRSA by PCR NEGATIVE NEGATIVE Final    Comment:        The GeneXpert MRSA Assay (FDA approved for NASAL specimens only), is one component of a comprehensive MRSA colonization surveillance program. It is not intended to diagnose MRSA infection nor to guide or monitor treatment for MRSA infections. Performed at [facility], [address], [place], [place] [address]     RADIOLOGY:  No results found.  Follow up with PCP in 1 week.  Management plans discussed with the patient, family and they are in agreement.  CODE STATUS:  Code Status History    Date Active Date Inactive Code Status Order ID Comments User Context   05/20/2018 2330 05/23/2018 1646 Full Code [id number]  Oralia Manis, MD Inpatient      TOTAL TIME TAKING CARE OF THIS PATIENT ON DAY OF DISCHARGE: more than 30 minutes.   Molinda Bailiff Larose Batres M.D on 05/27/2018 at 8:45 PM  Between 7am to 6pm - Pager - [phone]  After 6pm go to [email or web address] - password [email or web address]  [facility] [place] [facility]  Office  [phone]  CC: Primary care physician; Patient, No Pcp Per  Note:  This dictation was prepared with Dragon dictation along with smaller phrase technology. Any transcriptional errors that result from this process are unintentional.

## 2019-09-15 IMAGING — CT CT RENAL STONE PROTOCOL
3 of 4 series · 9 of 46 positions shown, 16 images · non-contrast
Comparison: CT 06/03/2011

CLINICAL DATA: Bilateral flank pain and low-grade fever.

EXAM:
CT ABDOMEN AND PELVIS WITHOUT CONTRAST
TECHNIQUE: Multidetector CT imaging of the abdomen and pelvis was performed
following the standard protocol without IV contrast.

[Series 4: lung bases · axial · 0.68mm/px · z∈[-149,-69]mm · 5 of 25 slices shown, 10 images]
[im 5/25  soft-tissue]
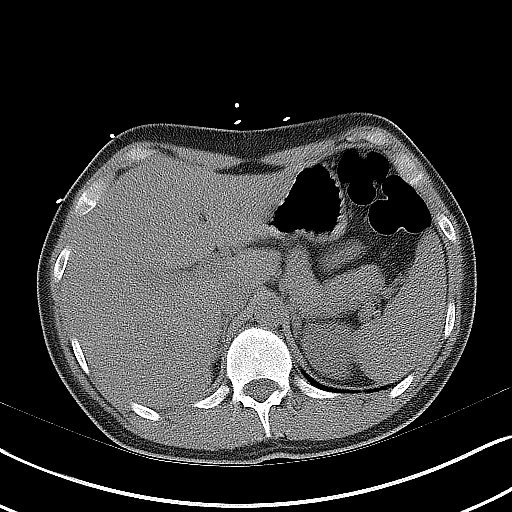
[im 5/25  bone]
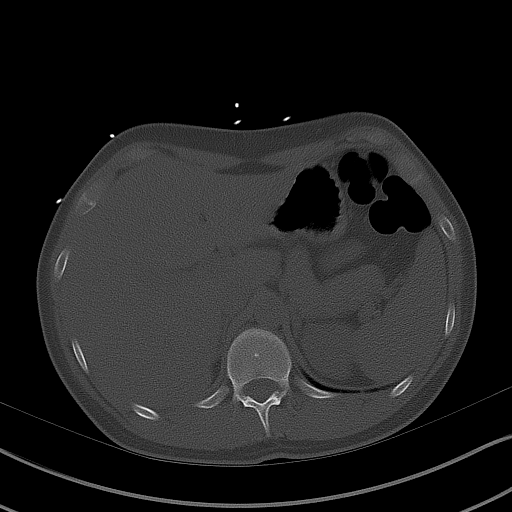
[im 9/25  soft-tissue]
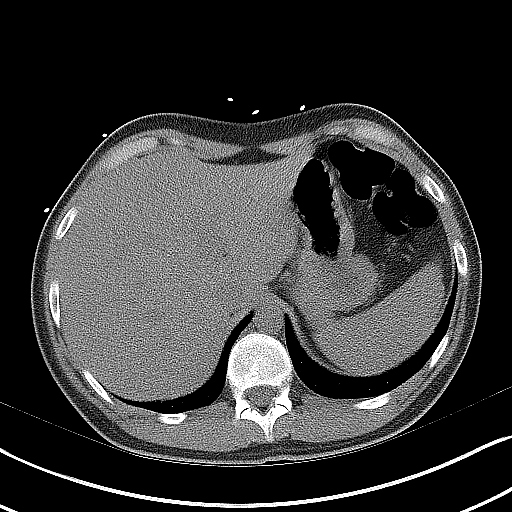
[im 9/25  lung]
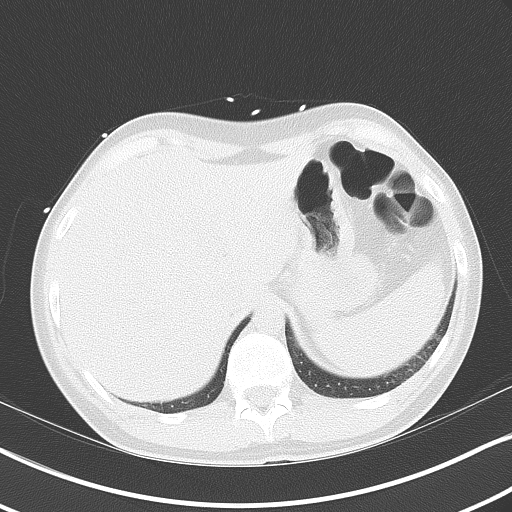
[im 13/25  soft-tissue]
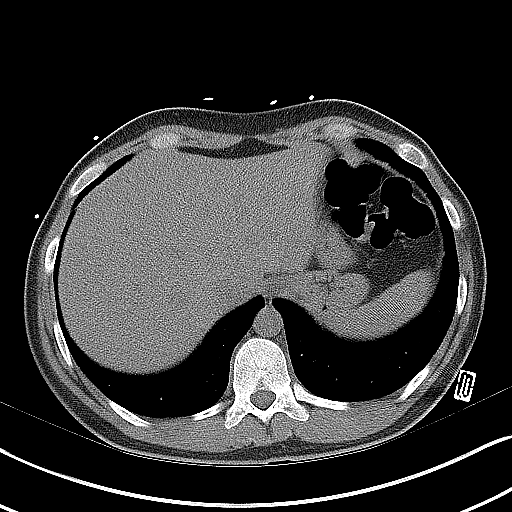
[im 13/25  lung]
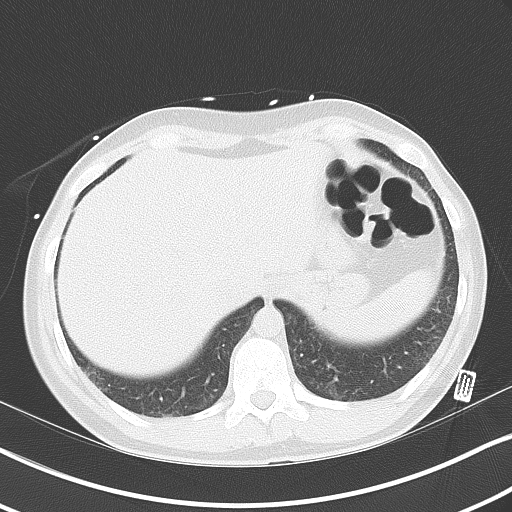
[im 17/25  soft-tissue]
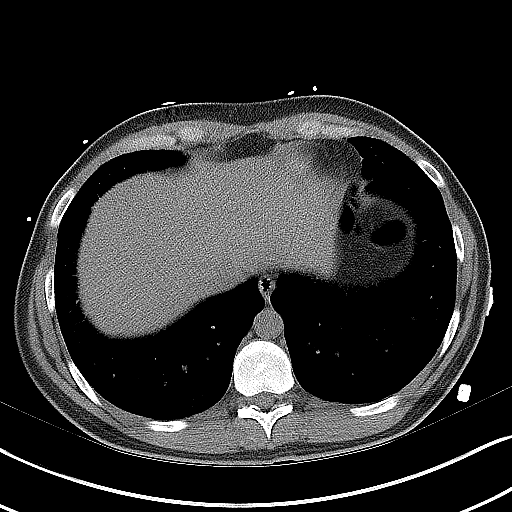
[im 17/25  lung]
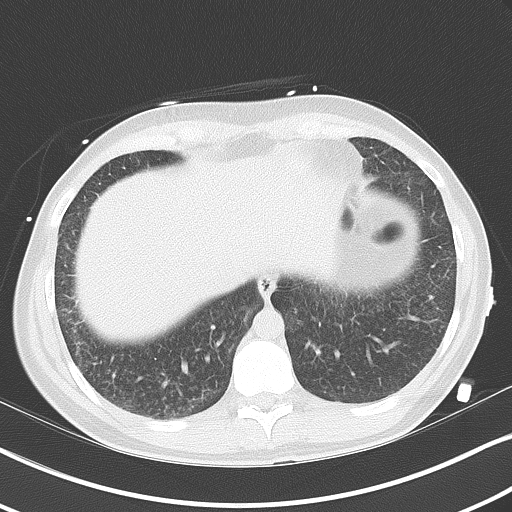
[im 21/25  soft-tissue]
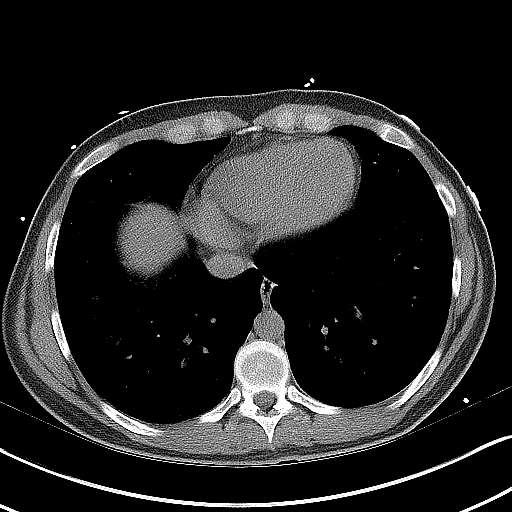
[im 21/25  lung]
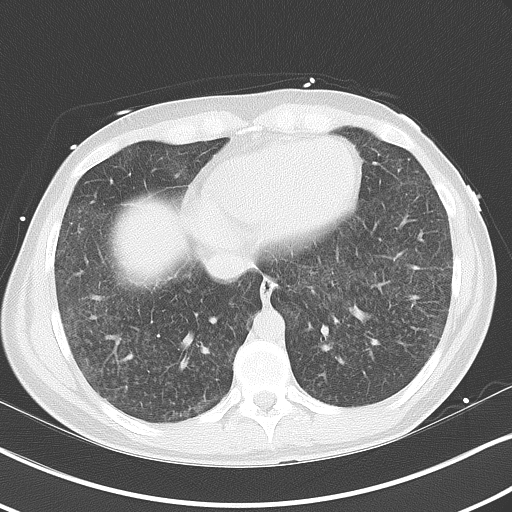

[Series 5: coronal · coronal · 0.64mm/px · 3 of 129 slices shown, 4 images]
[im 43/129  soft-tissue]
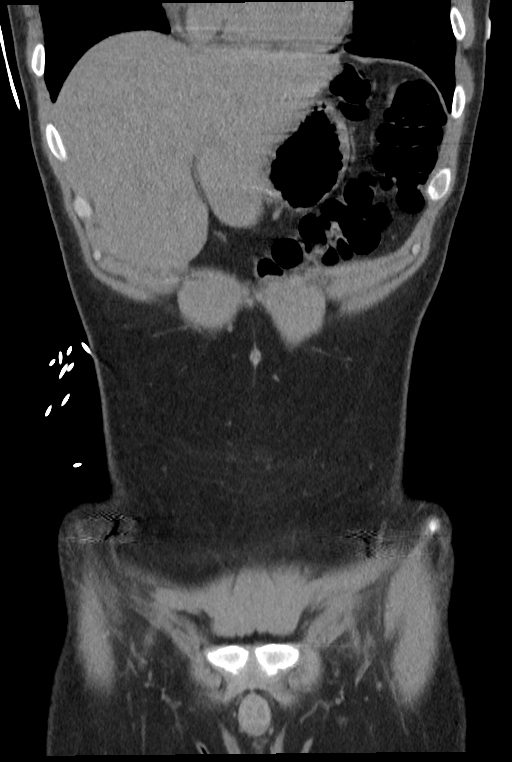
[im 57/129  soft-tissue]
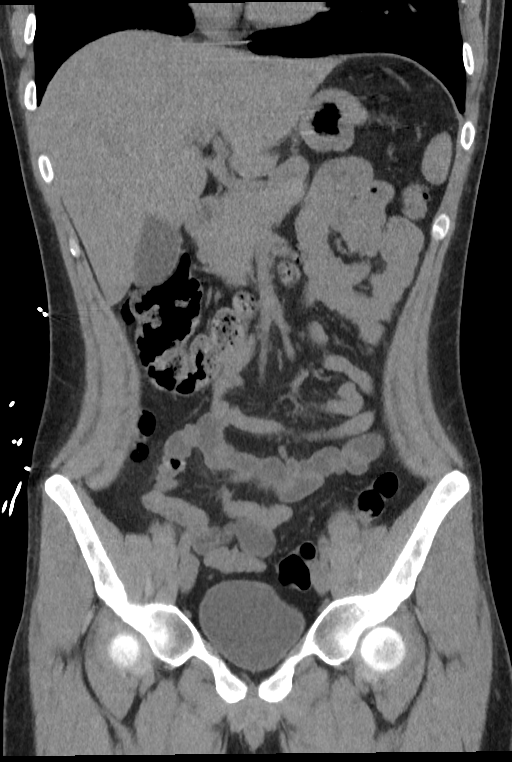
[im 57/129  bone]
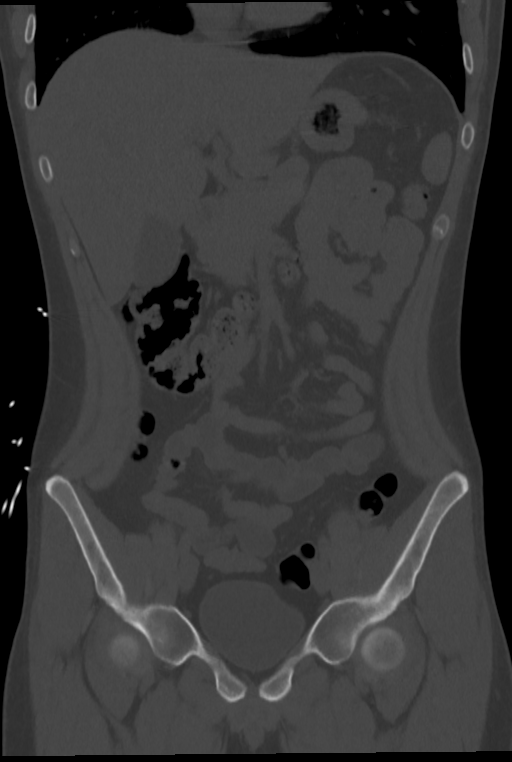
[im 72/129  soft-tissue]
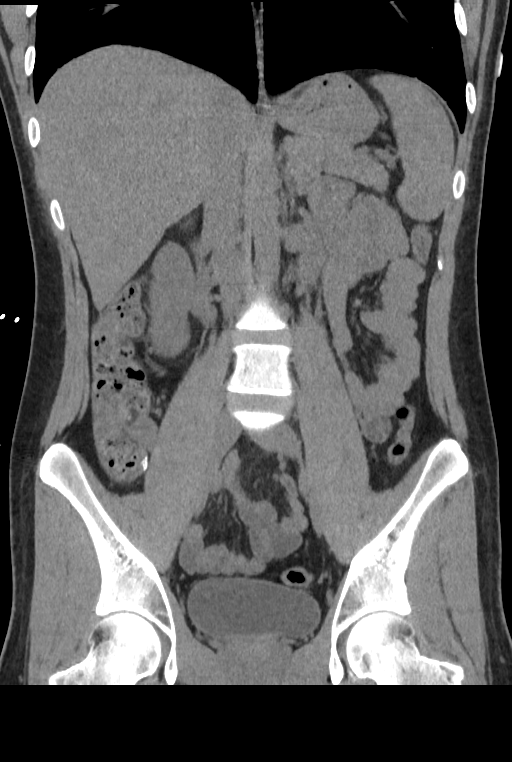

[Series 6: sagittal · sagittal · 0.49mm/px · 1 of 163 slices shown, 2 images]
[im 55/163  soft-tissue]
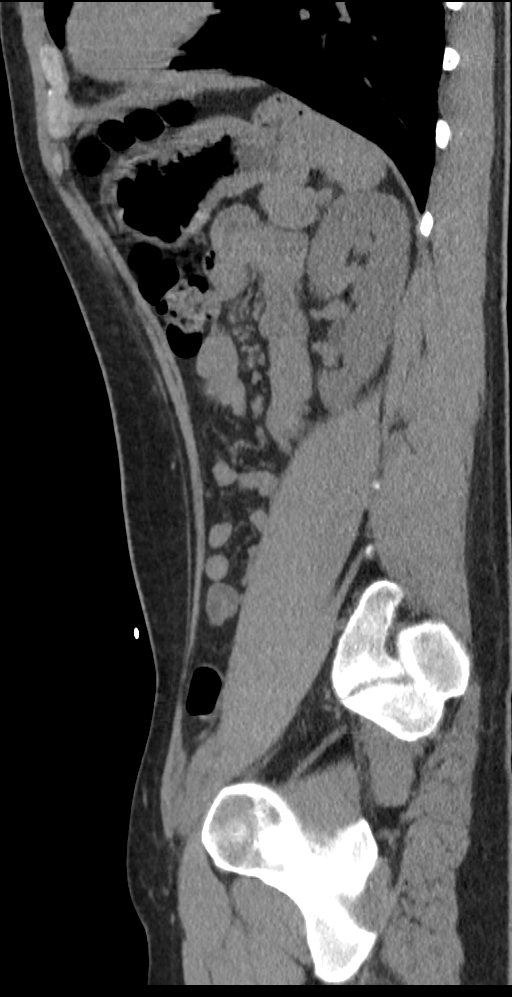
[im 55/163  bone]
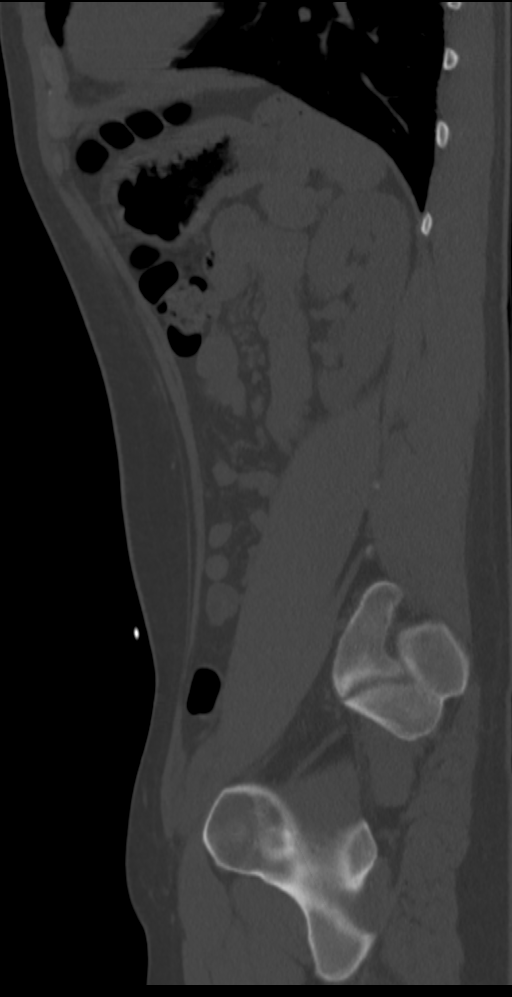

[9 of 46 positions shown; findings below may reference images not displayed]

FINDINGS: Lower chest: Widespread pulmonary density which is interstitial and
miliary consistent with acute widespread pneumonia. This could be
viral or due to other atypical organisms. No pleural effusion.

Hepatobiliary: Normal

Pancreas: Normal

Spleen: Normal

Adrenals/Urinary Tract: Adrenal glands are normal. Kidneys are
normal. No cyst, mass, stone or hydronephrosis. Bladder is normal.

Stomach/Bowel: No abnormal bowel finding.  Previous appendectomy.

Vascular/Lymphatic: Normal

Reproductive: Normal

Other: No free fluid or air.

Musculoskeletal: Normal
IMPRESSION: Widespread pulmonary abnormality with interstitial prominence and
miliary type airspace filling. This pattern is consistent with acute
widespread pneumonia and could be due to atypical organisms such as
viral pneumonia or tuberculosis. No pleural effusions.

No evidence of urinary tract stone disease. No abdominal or pelvic
pathology visible by CT. Previous appendectomy.

## 2019-09-15 IMAGING — DX PORTABLE CHEST - 1 VIEW
1 series · 1 of 1 positions shown · non-contrast
Comparison: None.

CLINICAL DATA: Syncope

EXAM:
PORTABLE CHEST 1 VIEW

[chest ap]
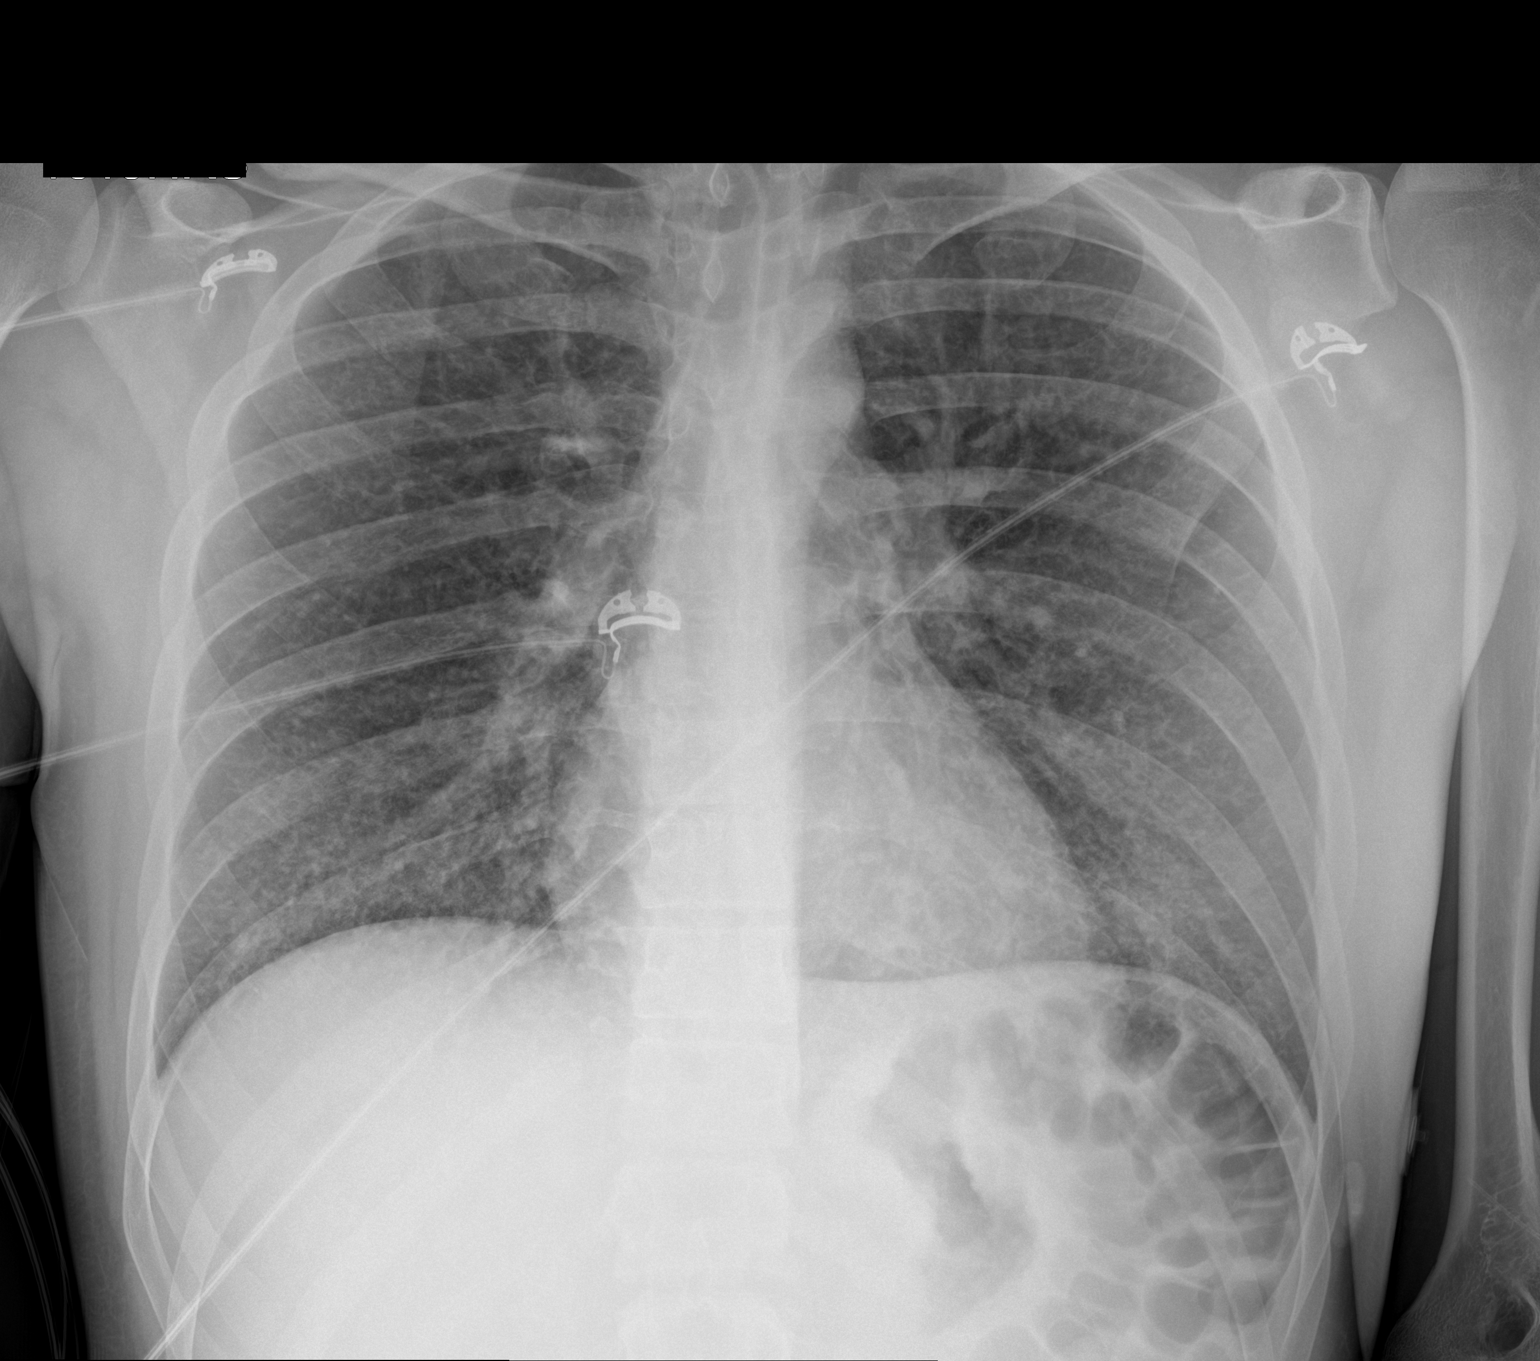

[1 of 1 positions shown; findings below may reference images not displayed]

FINDINGS: There is diffuse reticulonodular interstitial disease throughout the
lungs bilaterally. There is no appreciable frank airspace
consolidation. Heart size and pulmonary vascularity are normal. No
adenopathy. No bone lesions.
IMPRESSION: Diffuse reticulonodular interstitial disease of uncertain etiology.
Question atypical infection as a potential etiology for this
widespread finding. There is no airspace consolidation. Heart size
and pulmonary vascularity are normal. No adenopathy.

## 2022-07-31 ENCOUNTER — Ambulatory Visit
Admission: RE | Admit: 2022-07-31 | Discharge: 2022-07-31 | Disposition: A | Discharge status: Home / Self Care | Payer: No Typology Code available for payment source | Source: Ambulatory Visit

## 2022-07-31 VITALS — BP 131/90 | HR 100 | Temp 98.6°F | Resp 16

## 2022-07-31 DIAGNOSIS — L729 Follicular cyst of the skin and subcutaneous tissue, unspecified: Secondary | ICD-10-CM | POA: Diagnosis not present

## 2022-07-31 HISTORY — DX: Gilbert syndrome: E80.4

## 2022-07-31 NOTE — ED Triage Notes (Addendum)
Patient to Urgent Care with complaints of an abscess present to the top of his tailbone. Reports at times it does drain but then refills. No fevers.   Patient reports the area initially appeared apport 1-2 years ago but has continued to worsen over the last year. Has started to have difficulty at times and pain when sitting/ wearing curtain clothing.

## 2022-07-31 NOTE — Discharge Instructions (Addendum)
Schedule an appointment with a surgeon such as the one listed below.

## 2022-07-31 NOTE — ED Provider Notes (Signed)
Roy Torres    CSN: [id number] Arrival date & time: 07/31/22  1357      History   Chief Complaint Chief Complaint  Patient presents with   Abscess    Entered by patient    HPI Roy Torres is a 27 y.o. male.  Patient presents with a bump above his tailbone for a couple of years.  He has encouraged drainage from the area at times but no drainage recently.  It is painful and getting worse for the past year.  No fever, difficulty passing stool, numbness, weakness, or other symptoms.  His medical history includes anemia and Gilbert syndrome.    The history is provided by the patient and medical records.    Past Medical History:  Diagnosis Date   Anemia    Gilbert syndrome     Patient Active Problem List   Diagnosis Date Noted   Sepsis (HCC) 05/20/2018   Atypical pneumonia 05/20/2018    Past Surgical History:  Procedure Laterality Date   APPENDECTOMY     NO PAST SURGERIES         Home Medications    Prior to Admission medications   Medication Sig Start Date End Date Taking? Authorizing Provider  albuterol (VENTOLIN HFA) 108 (90 Base) MCG/ACT inhaler Inhale 2 puffs into the lungs every 6 (six) hours as needed for wheezing or shortness of breath. Patient not taking: Reported on 07/31/2022 05/23/18   Milagros Loll, MD  levofloxacin (LEVAQUIN) 750 MG tablet Take 1 tablet (750 mg total) by mouth daily. Patient not taking: Reported on 07/31/2022 05/23/18   Milagros Loll, MD    Family History History reviewed. No pertinent family history.  Social History Social History   Tobacco Use   Smoking status: Never   Smokeless tobacco: Never  Substance Use Topics   Alcohol use: Never   Drug use: Never     Allergies   Patient has no known allergies.   Review of Systems Review of Systems  Constitutional:  Negative for chills and fever.  Musculoskeletal:  Negative for arthralgias, back pain, gait problem and joint swelling.  Skin:  Positive for wound.  Negative for color change.  Neurological:  Negative for weakness and numbness.     Physical Exam Triage Vital Signs ED Triage Vitals  Enc Vitals Group     BP      Pulse      Resp      Temp      Temp src      SpO2      Weight      Height      Head Circumference      Peak Flow      Pain Score      Pain Loc      Pain Edu?      Excl. in GC?    No data found.  Updated Vital Signs BP (!) 131/90   Pulse 100   Temp 98.6 F (37 C)   Resp 16   SpO2 98%   Visual Acuity Right Eye Distance:   Left Eye Distance:   Bilateral Distance:    Right Eye Near:   Left Eye Near:    Bilateral Near:     Physical Exam Vitals and nursing note reviewed.  Constitutional:      General: He is not in acute distress.    Appearance: He is well-developed.  HENT:     Mouth/Throat:     Mouth: Mucous membranes are  moist.  Cardiovascular:     Rate and Rhythm: Normal rate and regular rhythm.     Heart sounds: Normal heart sounds.  Pulmonary:     Effort: Pulmonary effort is normal. No respiratory distress.     Breath sounds: Normal breath sounds.  Musculoskeletal:        General: No swelling or deformity. Normal range of motion.     Cervical back: Neck supple.  Skin:    General: Skin is warm and dry.     Findings: Lesion present.     Comments: Soft, fluctuant purple-red cyst at top of gluteal cleft.  No induration or drainage. See picture.    Neurological:     General: No focal deficit present.     Mental Status: He is alert and oriented to person, place, and time.     Sensory: No sensory deficit.     Motor: No weakness.     Gait: Gait normal.  Psychiatric:        Mood and Affect: Mood normal.        Behavior: Behavior normal.      UC Treatments / Results  Labs (all labs ordered are listed, but only abnormal results are displayed) Labs Reviewed - No data to display  EKG   Radiology No results found.  Procedures Procedures (including critical care time)  Medications  Ordered in UC Medications - No data to display  Initial Impression / Assessment and Plan / UC Course  I have reviewed the triage vital signs and the nursing notes.  Pertinent labs & imaging results that were available during my care of the patient were reviewed by me and considered in my medical decision making (see chart for details).    Cyst of buttock.  This appears to possibly be a keloid.  However, because of the location at the base of the spine, discussed follow-up with a general surgeon.  Contact information for on-call surgeon provided.  Instructed patient to avoid trauma to the area and not to try to drain it.  He agrees to plan of care.    Final Clinical Impressions(s) / UC Diagnoses   Final diagnoses:  Cyst of buttocks     Discharge Instructions      Schedule an appointment with a surgeon such as the one listed below.      ED Prescriptions   None    PDMP not reviewed this encounter.   Mickie Bail, NP 07/31/22 1434

## 2022-08-24 ENCOUNTER — Ambulatory Visit (INDEPENDENT_AMBULATORY_CARE_PROVIDER_SITE_OTHER): Payer: No Typology Code available for payment source | Admitting: Surgery

## 2022-08-24 ENCOUNTER — Encounter: Payer: Self-pay | Admitting: Surgery

## 2022-08-24 VITALS — BP 130/81 | HR 92 | Temp 98.0°F | Ht 72.0 in | Wt 175.2 lb

## 2022-08-24 DIAGNOSIS — L0591 Pilonidal cyst without abscess: Secondary | ICD-10-CM

## 2022-08-24 NOTE — Patient Instructions (Signed)
Our surgery scheduler Britta Mccreedy will call you within 24-48 hours to get you scheduled. If you have not heard from her after 48 hours, please call our office. Have the blue sheet available when she calls to write down important information.  If you have any concerns or questions, please feel free to call our office.   Pilonidal Cyst  A pilonidal cyst is a fluid-filled sac that forms under the skin near the tailbone, at the top of the crease of the buttocks (pilonidal area). Cysts that are small and not infected may not cause any problems. Cysts that become irritated or infected may grow and fill with pus. An infected cyst is called an abscess. A pilonidal abscess may cause pain and swelling. It may need to be drained or removed. What are the causes? The cause of this condition is not always known. In some cases, it may be caused by a hair that grows into your skin (ingrown hair). What increases the risk? You are more likely to develop this condition if: You are male. You have lots of hair near the crease of the buttocks. You are overweight. You have a dimple near the crease of the buttocks. You wear tight clothing. You do not bathe or shower often. You sit for long periods of time. What are the signs or symptoms? Symptoms of this condition may include pain, swelling, redness, and warmth in the pilonidal area. You may also be able to feel a lump near your tailbone if your cyst is big. If your cyst becomes infected, symptoms may include: Pus or fluid drainage. Fever. Pain, swelling, and redness getting worse. The lump getting bigger. How is this diagnosed? This condition may be diagnosed based on: Your symptoms and medical history. A physical exam. A blood test to check for infection. A test of a pus sample. How is this treated? You may not need any treatment if your cyst does not cause symptoms. If your cyst bothers you or is infected, you may need a procedure to drain or remove the  cyst. Depending on the size, location, and severity of your cyst, your health care provider may: Make an incision in the cyst and drain it (incision anddrainage). Open and drain the cyst, and then stitch the wound so that it stays open while it heals (marsupialization). You will be given instructions about how to care for your open wound while it heals. Remove all or part of the cyst, and then close the wound (cyst removal). You may need to take antibiotics before your procedure. Follow these instructions at home: Medicines Take over-the-counter and prescription medicines only as told by your health care provider. If you were prescribed antibiotics, take them as told by your health care provider. Do not stop taking the antibiotic even if you start to feel better. General instructions Keep the area around the cyst clean and dry. If there is fluid or pus draining from your cyst: Cover the area with a clean bandage (dressing). Wash the area gently with soap and water. Pat the area dry with a clean towel. Do not rub the area because that may cause bleeding. Remove hair from the area around the cyst only if your health care provider tells you to. Do not wear tight pants or sit in one position for long periods at a time. Contact a health care provider if: You have new redness, swelling, or pain. You have a fever. You have severe pain. Summary A pilonidal cyst is a fluid-filled sac that  forms under the skin near the tailbone, at the top of the crease of the buttocks (pilonidal area). Cysts that become irritated or infected may grow and fill with pus. An infected cyst is called an abscess. The cause of this condition is not always known. In some cases, it may be caused by a hair that grows into your skin (ingrown hair). You may not need any treatment if your cyst does not cause symptoms. If your cyst bothers you or is infected, you may need a procedure to drain or remove the cyst. This information  is not intended to replace advice given to you by your health care provider. Make sure you discuss any questions you have with your health care provider. Document Revised: 04/16/2021 Document Reviewed: 04/16/2021 Elsevier Patient Education  2024 [employer].

## 2022-08-24 NOTE — H&P (View-Only) (Signed)
 Patient ID: Roy Torres, male   DOB: January 17, 1996, 27 y.o.   MRN: [id number]  HPI Roy Torres is a 27 y.o. male he reports that for the last 2 years she has had a cyst around the sacral area.  Intermittently drains.  Does endorse some pain that is mild that usually get worse when sitting.  No fevers no chills.  Pain is sharp.  He does have a history of anemia and Gilbert's syndrome. He is able to perform more than 4 METS of activity without shortness of breath or chest pain.  CMP was normal CBC was normal except a hemoglobin of 11.  Prior history of appendectomy in 2013.  HPI  Past Medical History:  Diagnosis Date   Anemia    Gilbert syndrome     Past Surgical History:  Procedure Laterality Date   APPENDECTOMY     NO PAST SURGERIES      History reviewed. No pertinent family history.  Social History Social History   Tobacco Use   Smoking status: Never   Smokeless tobacco: Never  Substance Use Topics   Alcohol use: Never   Drug use: Never    No Known Allergies  No current outpatient medications on file.   No current facility-administered medications for this visit.     Review of Systems Full ROS  was asked and was negative except for the information on the HPI  Physical Exam Blood pressure 130/81, pulse 92, temperature 98 F (36.7 C), temperature source Oral, height 6' (1.829 m), weight 175 lb 3.2 oz (79.5 kg), SpO2 98%. CONSTITUTIONAL: NAD. EYES: Pupils are equal, round, Sclera are non-icteric. EARS, NOSE, MOUTH AND THROAT:  The oral mucosa is pink and moist. Hearing is intact to voice. LYMPH NODES:  Lymph nodes in the neck are normal. RESPIRATORY:  Lungs are clear. There is normal respiratory effort, with equal breath sounds bilaterally, and without pathologic use of accessory muscles. CARDIOVASCULAR: Heart is regular without murmurs, gallops, or rubs. GI: The abdomen is  soft, nontender, and nondistended. There are no palpable masses. There is no  hepatosplenomegaly. There are normal bowel sounds in all quadrants. GU: Rectal deferred.   MUSCULOSKELETAL: Normal muscle strength and tone. No cyanosis or edema.   SKIN: There is evidence of pilonidal cyst measuring 2 cm is mildly tender.  There is no evidence of abscess there is no evidence of cellulitis or infection. NEUROLOGIC: Motor and sensation is grossly normal. Cranial nerves are grossly intact. PSYCH:  Oriented to person, place and time. Affect is normal.  Data Reviewed  I have personally reviewed the patient's imaging, laboratory findings and medical records.    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  Discussed with the patient in detail and he is interested in pursuing surgical options.  I do recommend excision and let the wound heal by secondary intention.  Procedure discussed with him in detail.  Risk, benefits and possible medications including but not limited to, bleeding, prolonged wound healing, pain.  Recurrence.  He also understands that he needs to try to get the hair of the cleft to prevent future cyst.  I spent 40 minutes in this encounter including reviewing medical records, imaging studies, placing orders and performing documentation. A copy of this report was sent to the referring provider   Sterling Big, MD FACS General Surgeon 08/24/2022, 2:26 PM

## 2022-08-24 NOTE — Progress Notes (Signed)
Patient ID: Roy Torres, male   DOB: January 17, 1996, 27 y.o.   MRN: [id number]  HPI Roy Torres is a 27 y.o. male he reports that for the last 2 years she has had a cyst around the sacral area.  Intermittently drains.  Does endorse some pain that is mild that usually get worse when sitting.  No fevers no chills.  Pain is sharp.  He does have a history of anemia and Gilbert's syndrome. He is able to perform more than 4 METS of activity without shortness of breath or chest pain.  CMP was normal CBC was normal except a hemoglobin of 11.  Prior history of appendectomy in 2013.  HPI  Past Medical History:  Diagnosis Date   Anemia    Gilbert syndrome     Past Surgical History:  Procedure Laterality Date   APPENDECTOMY     NO PAST SURGERIES      History reviewed. No pertinent family history.  Social History Social History   Tobacco Use   Smoking status: Never   Smokeless tobacco: Never  Substance Use Topics   Alcohol use: Never   Drug use: Never    No Known Allergies  No current outpatient medications on file.   No current facility-administered medications for this visit.     Review of Systems Full ROS  was asked and was negative except for the information on the HPI  Physical Exam Blood pressure 130/81, pulse 92, temperature 98 F (36.7 C), temperature source Oral, height 6' (1.829 m), weight 175 lb 3.2 oz (79.5 kg), SpO2 98%. CONSTITUTIONAL: NAD. EYES: Pupils are equal, round, Sclera are non-icteric. EARS, NOSE, MOUTH AND THROAT:  The oral mucosa is pink and moist. Hearing is intact to voice. LYMPH NODES:  Lymph nodes in the neck are normal. RESPIRATORY:  Lungs are clear. There is normal respiratory effort, with equal breath sounds bilaterally, and without pathologic use of accessory muscles. CARDIOVASCULAR: Heart is regular without murmurs, gallops, or rubs. GI: The abdomen is  soft, nontender, and nondistended. There are no palpable masses. There is no  hepatosplenomegaly. There are normal bowel sounds in all quadrants. GU: Rectal deferred.   MUSCULOSKELETAL: Normal muscle strength and tone. No cyanosis or edema.   SKIN: There is evidence of pilonidal cyst measuring 2 cm is mildly tender.  There is no evidence of abscess there is no evidence of cellulitis or infection. NEUROLOGIC: Motor and sensation is grossly normal. Cranial nerves are grossly intact. PSYCH:  Oriented to person, place and time. Affect is normal.  Data Reviewed  I have personally reviewed the patient's imaging, laboratory findings and medical records.    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  Discussed with the patient in detail and he is interested in pursuing surgical options.  I do recommend excision and let the wound heal by secondary intention.  Procedure discussed with him in detail.  Risk, benefits and possible medications including but not limited to, bleeding, prolonged wound healing, pain.  Recurrence.  He also understands that he needs to try to get the hair of the cleft to prevent future cyst.  I spent 40 minutes in this encounter including reviewing medical records, imaging studies, placing orders and performing documentation. A copy of this report was sent to the referring provider   Sterling Big, MD FACS General Surgeon 08/24/2022, 2:26 PM

## 2022-08-25 ENCOUNTER — Telehealth: Payer: Self-pay | Admitting: Surgery

## 2022-08-25 ENCOUNTER — Other Ambulatory Visit: Payer: PRIVATE HEALTH INSURANCE

## 2022-08-25 NOTE — Telephone Encounter (Signed)
Patient has been advised of Pre-Admission date/time, and Surgery date at [facility].  Surgery Date: 09/15/22 Preadmission Testing Date: 09/08/22 (phone 8a-1p)  Patient has been made aware to call [phone], between 1-3:00pm the day before surgery, to find out what time to arrive for surgery.

## 2022-09-03 DIAGNOSIS — L0591 Pilonidal cyst without abscess: Secondary | ICD-10-CM

## 2022-09-03 HISTORY — DX: Pilonidal cyst without abscess: L05.91

## 2022-09-08 ENCOUNTER — Encounter
Admission: RE | Admit: 2022-09-08 | Discharge: 2022-09-08 | Disposition: A | Discharge status: Home / Self Care | Payer: No Typology Code available for payment source | Source: Ambulatory Visit | Attending: Surgery | Admitting: Surgery

## 2022-09-08 VITALS — Ht 72.0 in | Wt 175.0 lb

## 2022-09-08 DIAGNOSIS — Z01812 Encounter for preprocedural laboratory examination: Secondary | ICD-10-CM

## 2022-09-08 NOTE — Patient Instructions (Addendum)
Your procedure is scheduled on: Tuesday, August 13 Report to the Registration Desk on the 1st floor of the [facility] To find out your arrival time, please call [phone] between 1PM - 3PM on: Monday, August 12 If your arrival time is 6:00 am, do not arrive before that time as the [facility] entrance doors do not open until 6:00 am.  REMEMBER: Instructions that are not followed completely may result in serious medical risk, up to and including death; or upon the discretion of your surgeon and anesthesiologist your surgery may need to be rescheduled.  Do not eat food after midnight the night before surgery.  No gum chewing or hard candies.  You may however, drink CLEAR liquids up to 2 hours before you are scheduled to arrive for your surgery. Do not drink anything within 2 hours of your scheduled arrival time.  Clear liquids include: - water  - apple juice without pulp - gatorade (not RED colors) - black coffee or tea (Do NOT add milk or creamers to the coffee or tea) Do NOT drink anything that is not on this list.  One week prior to surgery: starting August 6 Stop Anti-inflammatories (NSAIDS) such as Advil, Aleve, Ibuprofen, Motrin, Naproxen, Naprosyn and Aspirin based products such as Excedrin, Goody's Powder, BC Powder. Stop ANY OVER THE COUNTER supplements until after surgery. You may however, continue to take Tylenol if needed for pain up until the day of surgery.  DO NOT TAKE ANY MEDICATIONS THE MORNING OF SURGERY   No Alcohol for 24 hours before or after surgery.  No Smoking including e-cigarettes for 24 hours before surgery.  No chewable tobacco products for at least 6 hours before surgery.  No nicotine patches on the day of surgery.  Do not use any "recreational" drugs for at least a week (preferably 2 weeks) before your surgery.  Please be advised that the combination of cocaine and anesthesia may have negative outcomes, up to and including death. If you test  positive for cocaine, your surgery will be cancelled.  On the morning of surgery brush your teeth with toothpaste and water, you may rinse your mouth with mouthwash if you wish. Do not swallow any toothpaste or mouthwash.  Use CHG Soap as directed on instruction sheet.  Do not wear jewelry, make-up, hairpins, clips or nail polish.  Do not wear lotions, powders, or perfumes.   Do not shave body hair from the neck down 48 hours before surgery.  Contact lenses, hearing aids and dentures may not be worn into surgery.  Do not bring valuables to the hospital. [facility] is not responsible for any missing/lost belongings or valuables.   Notify your doctor if there is any change in your medical condition (cold, fever, infection).  Wear comfortable clothing (specific to your surgery type) to the hospital.  After surgery, you can help prevent lung complications by doing breathing exercises.  Take deep breaths and cough every 1-2 hours.   If you are being discharged the day of surgery, you will not be allowed to drive home. You will need a responsible individual to drive you home and stay with you for 24 hours after surgery.   If you are taking public transportation, you will need to have a responsible individual with you.  Please call the Pre-admissions Testing Dept. at [phone] if you have any questions about these instructions.  Surgery Visitation Policy:  Patients having surgery or a procedure may have two visitors.  Children under the  age of 43 must have an adult with them who is not the patient.     Preparing for Surgery with CHLORHEXIDINE GLUCONATE (CHG) Soap  Chlorhexidine Gluconate (CHG) Soap  o An antiseptic cleaner that kills germs and bonds with the skin to continue killing germs even after washing  o Used for showering the night before surgery and morning of surgery  Before surgery, you can play an important role by reducing the number of germs on your skin.   CHG (Chlorhexidine gluconate) soap is an antiseptic cleanser which kills germs and bonds with the skin to continue killing germs even after washing.  Please do not use if you have an allergy to CHG or antibacterial soaps. If your skin becomes reddened/irritated stop using the CHG.  1. Shower the NIGHT BEFORE SURGERY and the MORNING OF SURGERY with CHG soap.  2. If you choose to wash your hair, wash your hair first as usual with your normal shampoo.  3. After shampooing, rinse your hair and body thoroughly to remove the shampoo.  4. Use CHG as you would any other liquid soap. You can apply CHG directly to the skin and wash gently with a scrungie or a clean washcloth.  5. Apply the CHG soap to your body only from the neck down. Do not use on open wounds or open sores. Avoid contact with your eyes, ears, mouth, and genitals (private parts). Wash face and genitals (private parts) with your normal soap.  6. Wash thoroughly, paying special attention to the area where your surgery will be performed.  7. Thoroughly rinse your body with warm water.  8. Do not shower/wash with your normal soap after using and rinsing off the CHG soap.  9. Pat yourself dry with a clean towel.  10. Wear clean pajamas to bed the night before surgery.  12. Place clean sheets on your bed the night of your first shower and do not sleep with pets.  13. Shower again with the CHG soap on the day of surgery prior to arriving at the hospital.  14. Do not apply any deodorants/lotions/powders.  15. Please wear clean clothes to the hospital.

## 2022-09-11 ENCOUNTER — Encounter
Admission: RE | Admit: 2022-09-11 | Discharge: 2022-09-11 | Disposition: A | Discharge status: Home / Self Care | Payer: No Typology Code available for payment source | Source: Ambulatory Visit | Attending: Surgery | Admitting: Surgery

## 2022-09-11 DIAGNOSIS — Z01812 Encounter for preprocedural laboratory examination: Secondary | ICD-10-CM | POA: Insufficient documentation

## 2022-09-11 LAB — BASIC METABOLIC PANEL
Anion gap: 7 (ref 5–15)
BUN: 15 mg/dL (ref 6–20)
CO2: 28 mmol/L (ref 22–32)
Calcium: 9.1 mg/dL (ref 8.9–10.3)
Chloride: 102 mmol/L (ref 98–111)
Creatinine, Ser: 0.91 mg/dL (ref 0.61–1.24)
GFR, Estimated: >60 mL/min (ref >60)
Glucose, Bld: 107 mg/dL — ABNORMAL HIGH (ref 70–99)
Potassium: 3.5 mmol/L (ref 3.5–5.1)
Sodium: 137 mmol/L (ref 135–145)

## 2022-09-11 LAB — CBC
HCT: 41.9 % (ref 39.0–52.0)
Hemoglobin: 14.6 g/dL (ref 13.0–17.0)
MCH: 30.9 pg (ref 26.0–34.0)
MCHC: 34.8 g/dL (ref 30.0–36.0)
MCV: 88.8 fL (ref 80.0–100.0)
Platelets: 248 10*3/uL (ref 150–400)
RBC: 4.72 MIL/uL (ref 4.22–5.81)
RDW: 12 % (ref 11.5–15.5)
WBC: 5.9 10*3/uL (ref 4.0–10.5)
nRBC: 0 % (ref 0.0–0.2)

## 2022-09-14 MED ORDER — CHLORHEXIDINE GLUCONATE CLOTH 2 % EX PADS: 6.0000 | MEDICATED_PAD | Freq: Once | CUTANEOUS | Status: DC

## 2022-09-14 MED ORDER — LACTATED RINGERS IV SOLN: INTRAVENOUS | Status: DC

## 2022-09-14 MED ORDER — ORAL CARE MOUTH RINSE: 15.0000 mL | Freq: Once | OROMUCOSAL | Status: AC

## 2022-09-14 MED ORDER — FAMOTIDINE 20 MG PO TABS
20.0000 mg | ORAL_TABLET | Freq: Once | ORAL | Status: AC
  Administered 2022-09-15: 20 mg via ORAL

## 2022-09-14 MED ORDER — GABAPENTIN 300 MG PO CAPS
300.0000 mg | ORAL_CAPSULE | ORAL | Status: AC
  Administered 2022-09-15: 300 mg via ORAL

## 2022-09-14 MED ORDER — SODIUM CHLORIDE 0.9 % IV SOLN
2.0000 g | INTRAVENOUS | Status: AC
  Administered 2022-09-15: 2 g via INTRAVENOUS

## 2022-09-14 MED ORDER — CHLORHEXIDINE GLUCONATE 0.12 % MT SOLN
15.0000 mL | Freq: Once | OROMUCOSAL | Status: AC
  Administered 2022-09-15: 15 mL via OROMUCOSAL

## 2022-09-14 MED ORDER — CELECOXIB 200 MG PO CAPS
200.0000 mg | ORAL_CAPSULE | ORAL | Status: AC
  Administered 2022-09-15: 200 mg via ORAL

## 2022-09-15 ENCOUNTER — Encounter
Admission: RE | Disposition: A | Discharge status: Home / Self Care | Payer: Self-pay | Source: Home / Self Care | Attending: Surgery

## 2022-09-15 ENCOUNTER — Encounter: Payer: Self-pay | Admitting: Surgery

## 2022-09-15 ENCOUNTER — Other Ambulatory Visit: Payer: Self-pay

## 2022-09-15 ENCOUNTER — Ambulatory Visit
Admission: RE | Admit: 2022-09-15 | Discharge: 2022-09-15 | Disposition: A | Discharge status: Home / Self Care | Payer: No Typology Code available for payment source | Attending: Surgery | Admitting: Surgery

## 2022-09-15 ENCOUNTER — Ambulatory Visit: Payer: No Typology Code available for payment source | Admitting: Anesthesiology

## 2022-09-15 ENCOUNTER — Ambulatory Visit: Payer: No Typology Code available for payment source | Admitting: Urgent Care

## 2022-09-15 DIAGNOSIS — Z9049 Acquired absence of other specified parts of digestive tract: Secondary | ICD-10-CM | POA: Diagnosis not present

## 2022-09-15 DIAGNOSIS — L0591 Pilonidal cyst without abscess: Secondary | ICD-10-CM

## 2022-09-15 DIAGNOSIS — Z87891 Personal history of nicotine dependence: Secondary | ICD-10-CM | POA: Insufficient documentation

## 2022-09-15 HISTORY — PX: PILONIDAL CYST EXCISION: SHX744

## 2022-09-15 SURGERY — EXCISION, PILONIDAL CYST, EXTENSIVE
Anesthesia: General

## 2022-09-15 MED ORDER — DEXAMETHASONE SODIUM PHOSPHATE 10 MG/ML IJ SOLN
INTRAMUSCULAR | Status: DC | PRN
  Administered 2022-09-15: 10 mg via INTRAVENOUS

## 2022-09-15 MED ORDER — ROCURONIUM BROMIDE 100 MG/10ML IV SOLN
INTRAVENOUS | Status: DC | PRN
  Administered 2022-09-15: 40 mg via INTRAVENOUS

## 2022-09-15 MED ORDER — PROPOFOL 10 MG/ML IV BOLUS
INTRAVENOUS | Status: DC | PRN
Start: 2022-09-15 — End: 2022-09-15
  Administered 2022-09-15: 150 mg via INTRAVENOUS

## 2022-09-15 MED ORDER — LIDOCAINE HCL (CARDIAC) PF 100 MG/5ML IV SOSY
PREFILLED_SYRINGE | INTRAVENOUS | Status: DC | PRN
  Administered 2022-09-15 (×2): 100 mg via INTRAVENOUS

## 2022-09-15 MED ORDER — OXYCODONE HCL 5 MG/5ML PO SOLN: 5.0000 mg | Freq: Once | ORAL | Status: DC | PRN

## 2022-09-15 MED ORDER — ONDANSETRON HCL 4 MG/2ML IJ SOLN: 4.0000 mg | Freq: Once | INTRAMUSCULAR | Status: DC | PRN

## 2022-09-15 MED ORDER — FENTANYL CITRATE (PF) 100 MCG/2ML IJ SOLN
INTRAMUSCULAR | Status: AC
  Filled 2022-09-15: qty 2

## 2022-09-15 MED ORDER — FENTANYL CITRATE (PF) 100 MCG/2ML IJ SOLN
INTRAMUSCULAR | Status: DC | PRN
  Administered 2022-09-15: 100 ug via INTRAVENOUS

## 2022-09-15 MED ORDER — OXYCODONE HCL 5 MG PO TABS: 5.0000 mg | ORAL_TABLET | Freq: Once | ORAL | Status: DC | PRN

## 2022-09-15 MED ORDER — SUGAMMADEX SODIUM 200 MG/2ML IV SOLN
INTRAVENOUS | Status: DC | PRN
  Administered 2022-09-15: 200 mg via INTRAVENOUS

## 2022-09-15 MED ORDER — BUPIVACAINE-EPINEPHRINE (PF) 0.25% -1:200000 IJ SOLN
INTRAMUSCULAR | Status: AC
  Filled 2022-09-15: qty 30

## 2022-09-15 MED ORDER — FAMOTIDINE 20 MG PO TABS
ORAL_TABLET | ORAL | Status: AC
  Filled 2022-09-15: qty 1

## 2022-09-15 MED ORDER — MIDAZOLAM HCL 2 MG/2ML IJ SOLN
INTRAMUSCULAR | Status: AC
  Filled 2022-09-15: qty 2

## 2022-09-15 MED ORDER — CHLORHEXIDINE GLUCONATE 0.12 % MT SOLN
OROMUCOSAL | Status: AC
  Filled 2022-09-15: qty 15

## 2022-09-15 MED ORDER — SODIUM CHLORIDE 0.9 % IV SOLN
INTRAVENOUS | Status: AC
  Filled 2022-09-15: qty 2

## 2022-09-15 MED ORDER — OXYCODONE HCL 5 MG PO TABS: 5.0000 mg | ORAL_TABLET | Freq: Four times a day (QID) | ORAL | 0 refills | Status: DC | PRN

## 2022-09-15 MED ORDER — DEXMEDETOMIDINE HCL IN NACL 200 MCG/50ML IV SOLN
INTRAVENOUS | Status: DC | PRN
Start: 2022-09-15 — End: 2022-09-15
  Administered 2022-09-15 (×2): 8 ug via INTRAVENOUS
  Administered 2022-09-15: 4 ug via INTRAVENOUS

## 2022-09-15 MED ORDER — BUPIVACAINE-EPINEPHRINE (PF) 0.25% -1:200000 IJ SOLN
INTRAMUSCULAR | Status: DC | PRN
  Administered 2022-09-15: 20 mL

## 2022-09-15 MED ORDER — MIDAZOLAM HCL 2 MG/2ML IJ SOLN
INTRAMUSCULAR | Status: DC | PRN
  Administered 2022-09-15: 2 mg via INTRAVENOUS

## 2022-09-15 MED ORDER — GABAPENTIN 300 MG PO CAPS
ORAL_CAPSULE | ORAL | Status: AC
  Filled 2022-09-15: qty 1

## 2022-09-15 MED ORDER — LACTATED RINGERS IV SOLN: INTRAVENOUS | Status: DC

## 2022-09-15 MED ORDER — FENTANYL CITRATE (PF) 100 MCG/2ML IJ SOLN: 25.0000 ug | INTRAMUSCULAR | Status: DC | PRN

## 2022-09-15 MED ORDER — ONDANSETRON HCL 4 MG/2ML IJ SOLN
INTRAMUSCULAR | Status: DC | PRN
  Administered 2022-09-15: 4 mg via INTRAVENOUS

## 2022-09-15 MED ORDER — CELECOXIB 200 MG PO CAPS
ORAL_CAPSULE | ORAL | Status: AC
  Filled 2022-09-15: qty 1

## 2022-09-15 SURGICAL SUPPLY — 37 items
ADH LQ OCL WTPRF AMP STRL LF (MISCELLANEOUS) ×1
ADH SKN CLS APL DERMABOND .7 (GAUZE/BANDAGES/DRESSINGS)
ADHESIVE MASTISOL STRL (MISCELLANEOUS) ×1 IMPLANT
APL PRP STRL LF DISP 70% ISPRP (MISCELLANEOUS) ×1
BANDAGE GAUZE 1X75IN STRL (MISCELLANEOUS) ×1 IMPLANT
BLADE CLIPPER SURG (BLADE) ×1 IMPLANT
BLADE SURG 15 STRL LF DISP TIS (BLADE) ×1 IMPLANT
BLADE SURG 15 STRL SS (BLADE) ×1
BNDG CMPR 75X11 PLY HI ABS (MISCELLANEOUS) ×1
BNDG GAUZE 1X75IN STRL (MISCELLANEOUS) ×1
CHLORAPREP W/TINT 26 (MISCELLANEOUS) ×1 IMPLANT
DERMABOND ADVANCED .7 DNX12 (GAUZE/BANDAGES/DRESSINGS) IMPLANT
DRAPE LAPAROTOMY 77X122 PED (DRAPES) ×1 IMPLANT
ELECT REM PT RETURN 9FT ADLT (ELECTROSURGICAL) ×1
ELECTRODE REM PT RTRN 9FT ADLT (ELECTROSURGICAL) ×1 IMPLANT
GAUZE 4X4 16PLY ~~LOC~~+RFID DBL (SPONGE) ×1 IMPLANT
GAUZE PACKING 0.25INX5YD STRL (GAUZE/BANDAGES/DRESSINGS) IMPLANT
GLOVE BIO SURGEON STRL SZ7 (GLOVE) ×1 IMPLANT
GOWN STRL REUS W/ TWL LRG LVL3 (GOWN DISPOSABLE) ×2 IMPLANT
GOWN STRL REUS W/TWL LRG LVL3 (GOWN DISPOSABLE) ×2
MANIFOLD NEPTUNE II (INSTRUMENTS) ×1 IMPLANT
NDL HYPO 22X1.5 SAFETY MO (MISCELLANEOUS) ×1 IMPLANT
NEEDLE HYPO 22X1.5 SAFETY MO (MISCELLANEOUS) ×1
PACK BASIN MINOR ARMC (MISCELLANEOUS) ×1 IMPLANT
PAD ABD DERMACEA PRESS 5X9 (GAUZE/BANDAGES/DRESSINGS) ×1 IMPLANT
SPONGE T-LAP 18X18 ~~LOC~~+RFID (SPONGE) ×1 IMPLANT
SUT MNCRL 4-0 (SUTURE)
SUT MNCRL 4-0 27XMFL (SUTURE)
SUT VIC AB 3-0 SH 27 (SUTURE)
SUT VIC AB 3-0 SH 27X BRD (SUTURE) IMPLANT
SUTURE MNCRL 4-0 27XMF (SUTURE) IMPLANT
SWAB CULTURE AMIES ANAERIB BLU (MISCELLANEOUS) ×1 IMPLANT
SYR 20ML LL LF (SYRINGE) IMPLANT
SYR BULB IRRIG 60ML STRL (SYRINGE) ×1 IMPLANT
TAPE CLOTH 3X10 WHT NS LF (GAUZE/BANDAGES/DRESSINGS) ×1 IMPLANT
TRAP FLUID SMOKE EVACUATOR (MISCELLANEOUS) ×1 IMPLANT
WATER STERILE IRR 500ML POUR (IV SOLUTION) ×1 IMPLANT

## 2022-09-15 NOTE — Discharge Instructions (Addendum)
Pilonidal Cyst Drainage, Care After The following information offers guidance on how to care for yourself after your procedure. Your health care provider may also give you more specific instructions. If you have problems or questions, contact your health care provider. What can I expect after the procedure? After the procedure, it is common to have: Pain that gets better when you take medicine. Some fluid or blood coming from your wound.  Follow these instructions at home:  Medicines Take over-the-counter and prescription medicines only as told by your health care provider. If you were prescribed antibiotics, take them as told by your health care provider. Do not stop using the antibiotic even if you start to feel better. Ask your health care provider if the medicine prescribed to you: Requires you to avoid driving or using machinery. Can cause constipation. You may need to take these actions to prevent or treat constipation: Drink enough fluid to keep your urine pale yellow. Take over-the-counter or prescription medicines. Eat foods that are high in fiber, such as beans, whole grains, and fresh fruits and vegetables. Limit foods that are high in fat and processed sugars, such as fried or sweet foods. Bathing Do not take baths or showers, swim, or use a hot tub until your health care provider approves. You may only be allowed to take sponge baths. When you can return to bathing will depend on the type of wound that you have. If your wound was packed with a germ-free packing material, keep the area dry until your packing has been removed. After the packing has been removed, you may start taking showers when your health care provider approves. If the edges of the incision around your wound were stitched to your skin (marsupialization), you may start taking showers the day after surgery, or when your health care provider approves. Let the water from the shower moisten your bandage (dressing).  This will make it easier to take off. Remove your dressing before you shower. While bathing, clean your buttocks area gently with soap and water. After bathing: Pat the area dry with a soft, clean towel. If directed, cover the area with a clean dressing. Wound care  You may need to have a caregiver help you with wound care and dressing changes. Follow instructions from your health care provider about how to take care of your wound. Make sure you: Wash your hands with soap and water for at least 20 seconds before and after you change your dressing. If soap and water are not available, use hand sanitizer. Change your dressing as told by your health care provider. Leave stitches (sutures), skin glue, or adhesive strips in place. These skin closures may need to stay in place for 2 weeks or longer. If adhesive strip edges start to loosen and curl up, you may trim the loose edges. Do not remove adhesive strips completely unless your health care provider tells you to do that. Check your wound every day for signs of infection. Check for: Redness, swelling, or more pain. More fluid or blood. Warmth. Pus or a bad smell. Follow any additional instructions from your health care provider on how to care for your wound, such as wound cleaning, wound flushing (irrigation), or packing your wound with a dressing. If you had marsupialization, ask your health care provider when you can stop using a dressing. Lifestyle: Do not do activities that irritate or put pressure on your buttocks for about 2 weeks, or as long as told by your health care provider. These  activities include bike riding, running, and anything that involves a twisting motion. Rest as told by your health care provider. Do not sit for a long time without moving. Get up to take short walks every 1-2 hours. This will improve blood flow and breathing. Ask for help if you feel weak or unsteady. Sleep on your side instead of your back. Avoid wearing  tight underwear and tight pants. Return to your normal activities as told by your health care provider. Ask your health care provider what activities are safe for you. General instructions: Do not use any products that contain nicotine or tobacco. These products include cigarettes, chewing tobacco, and vaping devices, such as e-cigarettes. These can delay wound healing after surgery. If you need help quitting, ask your health care provider. Keep all follow-up visits. This is important to monitor healing. If you had an incision and drainage procedure with wound packing, your packing may be changed or removed at follow-up visits. Contact a health care provider if: You have pain that does not get better with medicine. You have any of these signs of infection: More redness, swelling, or pain around your incision. More fluid or blood coming from your incision. Warmth coming from your incision. Pus or a bad smell coming from your incision. A fever. You have muscle aches. You feel generally sick. You are dizzy. Summary: A pilonidal cyst is a fluid-filled sac that forms under the skin near the tailbone. It is common to have some fluid or blood coming from your wound after a procedure to drain a pilonidal cyst. If you were prescribed antibiotics, take them as told by your health care provider. Do not stop taking the antibiotic even if you start to feel better. You may need to have a caregiver help you with wound care and dressing changes. Return to your health care provider as instructed to have any packing material changed or removed.  This information is not intended to replace advice given to you by your health care provider. Make sure you discuss any questions you have with your health care provider. Document Revised: 04/16/2021 Document Reviewed: 04/16/2021 Elsevier Patient Education  2024 [employer]    Wound Packing: Wound packing usually involves placing a moistened packing material  into your wound and then covering it with an outer bandage (dressing). This helps support the healing of deep tissue and tissue under the skin. It also helps prevent bleeding, infection, and further injury. Wounds are packed until deep tissues heal. The time it takes for this to happen is different for everyone. Your health care provider will show you how to pack and dress your wound. Using gloves and a clean technique is important to avoid spreading germs into your wound. Supplies needed: Soap and water. Disposable gloves. Cleansing or wetting solution, such as saline, germ-free (sterile) water, or an antiseptic solution. Clean bowl. Clean packing material, such as gauze, gauze sponges, or rolled gauze. Clean paper towels. Outer dressing. This includes the cover dressing and tape, or a dressing with an adhesive border. Cotton-tipped swabs. Small plastic bag for trash.  How to pack your wound: Follow your health care provider's instructions on how often you need to change dressings and pack your wound. You will likely be asked to change your dressings 1 to 2 times a day. Preparing to change the wound packing: If needed, take pain medicine 30 minutes before you pack your wound as told by your health care provider. Preparing the new packing material:  Clean and disinfect  your work surface or countertop. Set a plastic bag on or near your work surface. Wash your hands with soap and water for at least 20 seconds before you change the dressing. If soap and water are not available, use hand sanitizer. Put a clean paper towel on the counter. Put a clean bowl on the towel. Only touch the outside of the bowl when handling it. Pour the cleansing or wetting solution that your health care provider tells you to use into the bowl. Select and cut your packing material to fit the size of your wound. Avoid using multiple pieces of packing material. Drop it into the bowl. Cut tape strips that you will use to  seal the outer dressing, if needed. Put gauze pads for cleansing and cotton-tipped swabs on the clean paper towel. Removing the old packing material and dressing: Put on a set of gloves. Gently remove the old dressing and packing material. Make sure to check how the drainage looks or if there is any odor. Clean or rinse (irrigate) the wound. Remove your gloves. Put the removed items, including gloves, into the plastic bag to throw away later. Wash your hands again with soap and water for at least 20 seconds. If soap and water are not available, use hand sanitizer. Applying the new packing material and dressing:  Put on a new set of gloves. Squeeze the packing material in the bowl to release the extra liquid. The packing material should be moist, but not dripping wet. Gently place the packing material into the wound. Use a cotton-tipped swab to guide it into place, filling all of the space. Do not overpack the wound bed. Dry your gloved fingertips on the paper towel. Open up your outer dressing supplies and put them on a dry part of the paper towel. Keep them from getting wet. Place the outer dressing over the packed wound. Tape the edges of the outer dressing in place. Remove your gloves. Wash your hands again with soap and water for at least 20 seconds. If soap and water are not available, use hand sanitizer. Put the removed items, including gloves, into the plastic bag to throw away. Clean and disinfect your work surface or countertop. General tips: Follow your health care provider's instructions on how much to pack the wound. At first, you may need to pack it more fully to help stop bleeding. As the wound begins to heal inside, you will use less packing material and pack the wound loosely to allow the tissue to heal slowly from the inside out. Do not take baths, swim, or use a hot tub until your health care provider approves. Ask your health care provider if you may take showers. You may  only be allowed to take sponge baths. Keep the dressing clean and dry. Follow any other instructions given by your health care provider on how to aid healing. This may include applying warm or cold compresses, raising (elevating) the affected area, or wearing a compression dressing. Check your wound site every day for signs of infection. Check for: More redness, swelling, or pain. More fluid or blood. Warmth or hardness (induration). Pus or a bad smell. Protect your wound from the sun when you are outside for the first 6 months, or for as long as told by your health care provider. Cover up the scar area or apply sunscreen that has an SPF of at least 30. Keep all follow-up visits. This is important.  Contact a health care provider if: Your pain is  not controlled with pain medicine. You have more drainage, redness, swelling, or pain at your wound site. You have new rash, warmth, or induration around the wound. You have a fever or chills. Your wound becomes larger or deeper.  Get help right away if: The tissue inside your wound changes color from pink to white, yellow, or black. You notice a bad smell or pus coming from the wound site. You are having trouble packing your wound. Your wound is bleeding, and the bleeding does not stop with gentle pressure.  These symptoms may represent a serious problem that is an emergency. Do not wait to see if the symptoms will go away. Get medical help right away. Call your local emergency services (911 in the [place] Do not drive yourself to the hospital. Summary Wound packing usually involves placing a moistened packing material into your wound and then covering it with an outer bandage (dressing). Follow your health care provider's instructions on how often you need to change dressings and pack your wound. You will likely be asked to change dressings 1 to 2 times a day. When packing your wound, it is important to use gloves to avoid spreading germs into  the wound. Check your wound site every day for signs of infection.  This information is not intended to replace advice given to you by your health care provider. Make sure you discuss any questions you have with your health care provider. Document Revised: 05/28/2020 Document Reviewed: 05/28/2020 Elsevier Patient Education  2024 [employer]   AMBULATORY SURGERY  DISCHARGE INSTRUCTIONS  The drugs that you were given will stay in your system until tomorrow so for the next 24 hours you should not:  Drive an automobile Make any legal decisions Drink any alcoholic beverage  You may resume regular meals tomorrow.  Today it is better to start with liquids and gradually work up to solid foods.  You may eat anything you prefer, but it is better to start with liquids, then soup and crackers, and gradually work up to solid foods.  Please notify your doctor immediately if you have any unusual bleeding, trouble breathing, redness and pain at the surgery site, drainage, fever, or pain not relieved by medication.  Additional Instructions:  Please contact your physician with any problems or Same Day Surgery at [phone], Monday through Friday 6 am to 4 pm, or [place] at [facility] number at [phone].

## 2022-09-15 NOTE — Transfer of Care (Signed)
Immediate Anesthesia Transfer of Care Note  Patient: Roy Torres  Procedure(s) Performed: CYST EXCISION PILONIDAL EXTENSIVE  Patient Location: PACU  Anesthesia Type:General  Level of Consciousness: awake, alert , and oriented  Airway & Oxygen Therapy: Patient Spontanous Breathing  Post-op Assessment: Report given to RN and Post -op Vital signs reviewed and stable  Post vital signs: stable  Last Vitals:  Vitals Value Taken Time  BP 119/62 09/15/22 1024  Temp    Pulse 78 09/15/22 1026  Resp 16 09/15/22 1026  SpO2 100 % 09/15/22 1026  Vitals shown include unfiled device data.  Last Pain:  Vitals:   09/15/22 0755  TempSrc: Oral  PainSc: 0-No pain         Complications: No notable events documented.

## 2022-09-15 NOTE — Interval H&P Note (Signed)
History and Physical Interval Note:  09/15/2022 8:54 AM  Roy Torres  has presented today for surgery, with the diagnosis of pilonidal cyst.  The various methods of treatment have been discussed with the patient and family. After consideration of risks, benefits and other options for treatment, the patient has consented to  Procedure(s): CYST EXCISION PILONIDAL EXTENSIVE (N/A) as a surgical intervention.  The patient's history has been reviewed, patient examined, no change in status, stable for surgery.  I have reviewed the patient's chart and labs.  Questions were answered to the patient's satisfaction.     Kalyssa Anker F Vaun Hyndman

## 2022-09-15 NOTE — Anesthesia Procedure Notes (Signed)
Procedure Name: Intubation Date/Time: 09/15/2022 9:49 AM  Performed by: Maryla Morrow., CRNAPre-anesthesia Checklist: Patient identified, Patient being monitored, Timeout performed, Emergency Drugs available and Suction available Patient Re-evaluated:Patient Re-evaluated prior to induction Oxygen Delivery Method: Circle system utilized Preoxygenation: Pre-oxygenation with 100% oxygen Induction Type: IV induction Ventilation: Mask ventilation without difficulty Laryngoscope Size: McGraph and 4 Grade View: Grade I Tube type: Oral Tube size: 7.5 mm Number of attempts: 1 Airway Equipment and Method: Stylet Placement Confirmation: ETT inserted through vocal cords under direct vision, positive ETCO2 and breath sounds checked- equal and bilateral Secured at: 22 cm Tube secured with: Tape Dental Injury: Teeth and Oropharynx as per pre-operative assessment

## 2022-09-15 NOTE — Anesthesia Postprocedure Evaluation (Signed)
Anesthesia Post Note  Patient: Roy Torres  Procedure(s) Performed: CYST EXCISION PILONIDAL EXTENSIVE  Patient location during evaluation: PACU Anesthesia Type: General Level of consciousness: awake and alert, oriented and patient cooperative Pain management: pain level controlled Vital Signs Assessment: post-procedure vital signs reviewed and stable Respiratory status: spontaneous breathing, nonlabored ventilation and respiratory function stable Cardiovascular status: blood pressure returned to baseline and stable Postop Assessment: adequate PO intake Anesthetic complications: no   There were no known notable events for this encounter.   Last Vitals:  Vitals:   09/15/22 1045 09/15/22 1056  BP: 110/66 110/68  Pulse: (!) 57 (!) 52  Resp: 16 14  Temp: (!) 36.2 C (!) 36.4 C  SpO2: 96% 98%    Last Pain:  Vitals:   09/15/22 1056  TempSrc:   PainSc: 0-No pain                 Reed Breech

## 2022-09-15 NOTE — Anesthesia Preprocedure Evaluation (Addendum)
Anesthesia Evaluation  Patient identified by MRN, date of birth, ID band Patient awake    Reviewed: Allergy & Precautions, NPO status , Patient's Chart, lab work & pertinent test results  History of Anesthesia Complications Negative for: history of anesthetic complications  Airway Mallampati: II   Neck ROM: Full    Dental  (+) Chipped   Pulmonary former smoker (quit 5 years ago)   Pulmonary exam normal breath sounds clear to auscultation       Cardiovascular Exercise Tolerance: Good negative cardio ROS Normal cardiovascular exam Rhythm:Regular Rate:Normal     Neuro/Psych negative neurological ROS     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,,,Gilbert syndrome   Endo/Other  negative endocrine ROS    Renal/GU negative Renal ROS     Musculoskeletal   Abdominal   Peds  Hematology  (+) Blood dyscrasia, anemia   Anesthesia Other Findings   Reproductive/Obstetrics                             Anesthesia Physical Anesthesia Plan  ASA: 2  Anesthesia Plan: General   Post-op Pain Management:    Induction: Intravenous  PONV Risk Score and Plan: 2 and Ondansetron, Dexamethasone and Treatment may vary due to age or medical condition  Airway Management Planned: Oral ETT  Additional Equipment:   Intra-op Plan:   Post-operative Plan: Extubation in OR  Informed Consent: I have reviewed the patients History and Physical, chart, labs and discussed the procedure including the risks, benefits and alternatives for the proposed anesthesia with the patient or authorized representative who has indicated his/her understanding and acceptance.     Dental advisory given  Plan Discussed with: CRNA  Anesthesia Plan Comments: (Patient consented for risks of anesthesia including but not limited to:  - adverse reactions to medications - damage to eyes, teeth, lips or other oral mucosa - nerve damage due to  positioning  - sore throat or hoarseness - damage to heart, brain, nerves, lungs, other parts of body or loss of life  Informed patient about role of CRNA in peri- and intra-operative care.  Patient voiced understanding.)        Anesthesia Quick Evaluation

## 2022-09-15 NOTE — Op Note (Addendum)
  09/15/2022  10:10 AM  PATIENT:  Tyson Babinski  27 y.o. male  PRE-OPERATIVE DIAGNOSIS:  Pilonidal disease  POST-OPERATIVE DIAGNOSIS:  Same  PROCEDURE: 1. Excision of complex pilonidal disease 2. 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 The patient taken to the operating room and placed in the prone position. Off midline incision was created incorporating dermal aspect of complex pilonidal disease. There were complex luculations that we were able to lyse with a suction device. All the loculations were broken down. Using a sharp curette we debrided the sub q tissue down to the fascia. No further tracks were seen. Hemostasis was obtained with electrocautery. Irrigation with normal saline and the wound was packed with half-inch packing. Marcaine quarter percent with epinephrine was injected around the wound site. Needle and laparotomy counts were correct and there were no immediate complications  Leafy Ro, MD

## 2022-09-18 ENCOUNTER — Encounter: Payer: Self-pay | Admitting: Surgery

## 2022-09-30 ENCOUNTER — Ambulatory Visit (INDEPENDENT_AMBULATORY_CARE_PROVIDER_SITE_OTHER): Payer: No Typology Code available for payment source | Admitting: Surgery

## 2022-09-30 ENCOUNTER — Encounter: Payer: Self-pay | Admitting: Surgery

## 2022-09-30 VITALS — BP 121/75 | HR 85 | Temp 98.4°F | Ht 72.0 in | Wt 179.4 lb

## 2022-09-30 DIAGNOSIS — Z09 Encounter for follow-up examination after completed treatment for conditions other than malignant neoplasm: Secondary | ICD-10-CM

## 2022-09-30 DIAGNOSIS — L0591 Pilonidal cyst without abscess: Secondary | ICD-10-CM

## 2022-09-30 NOTE — Patient Instructions (Signed)
Pilonidal Cyst Removal, Care After The following information offers guidance on how to care for yourself after your procedure. Your health care provider may also give you more specific instructions. If you have problems or questions, contact your health care provider. What can I expect after the procedure? After the procedure, it is common to have: Pain. Redness. Some swelling. Some fluid or blood coming from your incision (drainage). You may have more drainage if you have an open incision. Follow these instructions at home: Medicines Take over-the-counter and prescription medicines only as told by your health care provider. If you were prescribed antibiotics, take them as told by your health care provider. Do not stop using the antibiotic even if you start to feel better. Ask your health care provider if the medicine prescribed to you: Requires you to avoid driving or using machinery. Can cause constipation. You may need to take these actions to prevent or treat constipation: Drink enough fluid to keep your urine pale yellow. Take over-the-counter or prescription medicines. Eat foods that are high in fiber, such as beans, whole grains, and fresh fruits and vegetables. Limit foods that are high in fat and processed sugars, such as fried or sweet foods. Incision care  You may need to have a caregiver help you with wound care and dressing changes. Follow instructions from your health care provider about how to take care of your incision. Make sure you: Wash your hands with soap and water for at least 20 seconds before and after you change your bandage (dressing). If soap and water are not available, use hand sanitizer. Change your dressing as told by your health care provider. Leave stitches (sutures), skin glue, or adhesive strips in place. These skin closures may need to stay in place for 2 weeks or longer. If adhesive strip edges start to loosen and curl up, you may trim the loose edges. Do  not remove adhesive strips completely unless your health care provider tells you to do that. Check your incision area every day for signs of infection. If it is hard to see the area, have someone check for you. Check for: More redness, swelling, or pain. More fluid or blood. Warmth. Pus or a bad smell. Managing pain and swelling If directed, put ice on the affected area. To do this: Put ice in a plastic bag. Place a towel between your skin and the bag. Leave the ice on for 20 minutes, 2-3 times a day. If your skin turns bright red, remove the ice right away to prevent skin damage. The risk of skin damage is higher if you cannot feel pain, heat, or cold. Activity Do not do activities that cause pain or irritate the incision area. These may include bike riding, running, sit ups, or anything that involves a twisting motion. Rest as told by your health care provider. Do not sit for a long time without moving. Get up to take short walks every 1-2 hours. This will improve blood flow and breathing. Ask for help if you feel weak or unsteady. Return to your normal activities as told by your health care provider. Ask your health care provider what activities are safe for you. General instructions Do not use any products that contain nicotine or tobacco. These products include cigarettes, chewing tobacco, and vaping devices, such as e-cigarettes. These can delay healing after surgery. If you need help quitting, ask your health care provider. Do not take baths, swim, or use a hot tub until your health care provider approves.   Ask your health care provider if you may take showers. You may only be allowed to take sponge baths. Keep all follow-up visits. This is important to monitor healing. If you had a procedure with wound packing, your packing may be changed or removed at follow-up visits. Contact a health care provider if: You have pain that does not get better with medicine. You have any of these signs  of infection: More redness, swelling, or pain around your incision. More fluid or blood coming from your incision. Warmth coming from your incision. Pus or a bad smell coming from your incision. A fever. Get help right away if: You have severe pain in your abdomen. You have sudden chest pain and shortness of breath. You cough up blood. You faint or lose consciousness. These symptoms may be an emergency. Get help right away. Call 911. Do not wait to see if the symptoms will go away. Do not drive yourself to the hospital. Summary It is common to have some pain and drainage after your procedure. You may have more drainage if you have an open incision. You may need to have a caregiver help you with wound care and dressing changes. Do not do activities that cause pain or irritate the incision area. Contact your health care provider if you have pain that does not get better with medicine or if you have any signs of infection. This information is not intended to replace advice given to you by your health care provider. Make sure you discuss any questions you have with your health care provider. Document Revised: 04/25/2021 Document Reviewed: 04/25/2021 [employer] Patient Education  2024 [employer]  

## 2022-10-01 NOTE — Progress Notes (Signed)
Roy Torres is following excision of pilonidal.  Wound is healing by secondary intention.  He is healing well without issues.  PE NAD Wound healing well, measures 2.5 cm in length and 5 mm in diameter.  It is superficial.  No need for packing.  Band-Aid placed  A/P doing well Advised him about hair trimming No surgical issues, rtc prn

## 2024-01-21 ENCOUNTER — Other Ambulatory Visit (HOSPITAL_COMMUNITY): Payer: Self-pay
